# Patient Record
Sex: Male | Born: 1955 | Race: White | Hispanic: No | Marital: Married | Smoking: Former smoker
Health system: Southern US, Community
[De-identification: ages and names within clinical notes are randomized; demographics above are authoritative.]

## PROBLEM LIST (undated history)

## (undated) DIAGNOSIS — I2699 Other pulmonary embolism without acute cor pulmonale: Secondary | ICD-10-CM

## (undated) DIAGNOSIS — E7800 Pure hypercholesterolemia, unspecified: Secondary | ICD-10-CM

## (undated) DIAGNOSIS — Q619 Cystic kidney disease, unspecified: Secondary | ICD-10-CM

## (undated) DIAGNOSIS — K635 Polyp of colon: Secondary | ICD-10-CM

## (undated) DIAGNOSIS — R12 Heartburn: Secondary | ICD-10-CM

## (undated) DIAGNOSIS — R3129 Other microscopic hematuria: Secondary | ICD-10-CM

## (undated) DIAGNOSIS — C61 Malignant neoplasm of prostate: Secondary | ICD-10-CM

## (undated) DIAGNOSIS — E079 Disorder of thyroid, unspecified: Secondary | ICD-10-CM

## (undated) HISTORY — PX: ANKLE SURGERY: SHX546

## (undated) HISTORY — DX: Heartburn: R12

## (undated) HISTORY — DX: Polyp of colon: K63.5

## (undated) HISTORY — PX: COLONOSCOPY W/ POLYPECTOMY: SHX1380

## (undated) HISTORY — DX: Disorder of thyroid, unspecified: E07.9

## (undated) HISTORY — DX: Other microscopic hematuria: R31.29

## (undated) HISTORY — DX: Other pulmonary embolism without acute cor pulmonale: I26.99

## (undated) HISTORY — DX: Cystic kidney disease, unspecified: Q61.9

## (undated) HISTORY — DX: Malignant neoplasm of prostate: C61

## (undated) HISTORY — PX: APPENDECTOMY: SHX54

## (undated) HISTORY — DX: Pure hypercholesterolemia, unspecified: E78.00

---

## 1998-10-26 ENCOUNTER — Other Ambulatory Visit: Admission: RE | Admit: 1998-10-26 | Discharge: 1998-10-26 | Payer: Self-pay | Admitting: Internal Medicine

## 1999-02-25 ENCOUNTER — Emergency Department (HOSPITAL_COMMUNITY): Admission: EM | Admit: 1999-02-25 | Discharge: 1999-02-25 | Payer: Self-pay | Admitting: Emergency Medicine

## 2000-06-12 ENCOUNTER — Encounter: Payer: Self-pay | Admitting: Emergency Medicine

## 2000-06-12 ENCOUNTER — Emergency Department (HOSPITAL_COMMUNITY): Admission: EM | Admit: 2000-06-12 | Discharge: 2000-06-12 | Payer: Self-pay | Admitting: Emergency Medicine

## 2001-05-08 ENCOUNTER — Encounter (INDEPENDENT_AMBULATORY_CARE_PROVIDER_SITE_OTHER): Payer: Self-pay | Admitting: Specialist

## 2001-05-08 ENCOUNTER — Other Ambulatory Visit: Admission: RE | Admit: 2001-05-08 | Discharge: 2001-05-08 | Payer: Self-pay | Admitting: Internal Medicine

## 2004-11-30 ENCOUNTER — Ambulatory Visit: Payer: Self-pay | Admitting: Internal Medicine

## 2004-12-18 ENCOUNTER — Ambulatory Visit: Payer: Self-pay | Admitting: Internal Medicine

## 2006-04-14 ENCOUNTER — Encounter: Admission: RE | Admit: 2006-04-14 | Discharge: 2006-04-14 | Payer: Self-pay | Admitting: Internal Medicine

## 2007-09-17 DIAGNOSIS — Q619 Cystic kidney disease, unspecified: Secondary | ICD-10-CM

## 2007-09-17 DIAGNOSIS — R3129 Other microscopic hematuria: Secondary | ICD-10-CM

## 2007-09-17 HISTORY — DX: Cystic kidney disease, unspecified: Q61.9

## 2007-09-17 HISTORY — DX: Other microscopic hematuria: R31.29

## 2008-07-08 ENCOUNTER — Encounter: Admission: RE | Admit: 2008-07-08 | Discharge: 2008-07-08 | Payer: Self-pay | Admitting: Internal Medicine

## 2009-11-20 ENCOUNTER — Encounter (INDEPENDENT_AMBULATORY_CARE_PROVIDER_SITE_OTHER): Payer: Self-pay | Admitting: *Deleted

## 2010-03-30 ENCOUNTER — Encounter: Payer: Self-pay | Admitting: Internal Medicine

## 2010-04-30 ENCOUNTER — Encounter (INDEPENDENT_AMBULATORY_CARE_PROVIDER_SITE_OTHER): Payer: Self-pay | Admitting: *Deleted

## 2010-05-03 ENCOUNTER — Ambulatory Visit: Payer: Self-pay | Admitting: Internal Medicine

## 2010-05-17 ENCOUNTER — Ambulatory Visit: Payer: Self-pay | Admitting: Internal Medicine

## 2010-10-16 NOTE — Letter (Signed)
Summary: [facility]   Imported By: Lester Eustis 06/01/2010 14:05:10  _____________________________________________________________________  External Attachment:    Type:   Image     Comment:   External Document

## 2010-10-16 NOTE — Procedures (Signed)
Summary: Colonoscopy  Patient: Maurice Gomez Note: All result statuses are Final unless otherwise noted.  Tests: (1) Colonoscopy (COL)   COL Colonoscopy           DONE     [facility]     [address]     [place], [place]  [address]           COLONOSCOPY PROCEDURE REPORT           PATIENT:  Maurice, Gomez  MR#:  [id number]     BIRTHDATE:  07/25/1956, 53 yrs. old  GENDER:  male     ENDOSCOPIST:  Wilhemina Bonito. Eda Keys, MD     REF. BY:  Surveillance Program Recall,     PROCEDURE DATE:  05/17/2010     PROCEDURE:  Surveillance Colonoscopy     ASA CLASS:  Class II     INDICATIONS:  history of pre-cancerous (adenomatous) colon polyps,     surveillance and high-risk screening ; index 2000 w/ Ta; f/u 2002,     2006     MEDICATIONS:   Fentanyl 100 mcg IV, Versed 10 mg IV           DESCRIPTION OF PROCEDURE:   After the risks benefits and     alternatives of the procedure were thoroughly explained, informed     consent was obtained.  Digital rectal exam was performed and     revealed no abnormalities.   The LB [id number] endoscope     was introduced through the anus and advanced to the cecum, which     was identified by both the appendix and ileocecal valve, without     limitations.Time to cecum = 3:53 min.  The quality of the prep was     excellent, using MoviPrep.  The instrument was then slowly     withdrawn (time = 9:10 min) as the colon was fully examined.     <<PROCEDUREIMAGES>>           FINDINGS:  Moderate diverticulosis was found in the sigmoid colon.     This was otherwise a normal examination of the colon.  No polyps     or cancers were seen.   Retroflexed views in the rectum revealed     no abnormalities.    The scope was then withdrawn from the patient     and the procedure completed.           COMPLICATIONS:  None     ENDOSCOPIC IMPRESSION:     1) Moderate diverticulosis in the sigmoid colon     2) Otherwise normal examination     3) No polyps  or cancers           RECOMMENDATIONS:     1) Follow up colonoscopy in 5 years           ______________________________     Wilhemina Bonito. Eda Keys, MD           CC:  Georgann Housekeeper MD;The Patient           n.     Rosalie DoctorWilhemina Bonito. Eda Keys at 05/17/2010 12:39 PM           Richter, Noreene Larsson, [phone]  Note: An exclamation mark (!) indicates a result that was not dispersed into the flowsheet. Document Creation Date: 05/17/2010 12:40 PM _______________________________________________________________________  (1) Order result status: Final Collection or observation date-time: 05/17/2010 12:32 Requested date-time:  Receipt date-time:  Reported date-time:  Referring Physician:   Ordering Physician: Fransico Setters [phone]) Specimen Source:  Source: Launa Grill Order Number: [id number] Lab site:   Appended Document: Colonoscopy    Clinical Lists Changes  Observations: Added new observation of COLONNXTDUE: 05/2015 (05/17/2010 12:45)

## 2010-10-16 NOTE — Letter (Signed)
Summary: Previsit letter  [facility]  [address] [place], [place] [address]   Phone: [phone]  Fax: [phone]       03/30/2010 MRN: [id number]  [facility] [address] [place], [place]  [address]  Dear Mr. Maurice Gomez,  Welcome to the Gastroenterology Division at [facility]    You are scheduled to see a nurse for your pre-procedure visit on 05-03-10 at 4pm on the 3rd floor at [facility], [address]  We ask that you try to arrive at our office 15 minutes prior to your appointment time to allow for check-in.  Your nurse visit will consist of discussing your medical and surgical history, your immediate family medical history, and your medications.    Please bring a complete list of all your medications or, if you prefer, bring the medication bottles and we will list them.  We will need to be aware of both prescribed and over the counter drugs.  We will need to know exact dosage information as well.  If you are on blood thinners (Coumadin, Plavix, Aggrenox, Ticlid, etc.) please call our office today/prior to your appointment, as we need to consult with your physician about holding your medication.   Please be prepared to read and sign documents such as consent forms, a financial agreement, and acknowledgement forms.  If necessary, and with your consent, a friend or relative is welcome to sit-in on the nurse visit with you.  Please bring your insurance card so that we may make a copy of it.  If your insurance requires a referral to see a specialist, please bring your referral form from your primary care physician.  No co-pay is required for this nurse visit.     If you cannot keep your appointment, please call [phone] to cancel or reschedule prior to your appointment date.  This allows [place] the opportunity to schedule an appointment for another patient in need of care.    Thank you for choosing [facility] for your medical  needs.  We appreciate the opportunity to care for you.  Please visit [place] at our website  to learn more about our practice.                     Sincerely.                                                                                                                   The Gastroenterology Division

## 2010-10-16 NOTE — Miscellaneous (Signed)
Summary: [facility] Previsit/prep  Clinical Lists Changes  Medications: Added new medication of MOVIPREP 100 GM  SOLR (PEG-KCL-NACL-NASULF-NA ASC-C) As per prep instructions. - Signed Rx of MOVIPREP 100 GM  SOLR (PEG-KCL-NACL-NASULF-NA ASC-C) As per prep instructions.;  #1 x 0;  Signed;  Entered by: Wyona Almas RN;  Authorized by: Hilarie Fredrickson MD;  Method used: Electronically to [facility] [address] [place], [place], [place]  [address], Ph: [phone], Fax: [phone] Observations: Added new observation of NKA: T (05/03/2010 16:07)    Prescriptions: MOVIPREP 100 GM  SOLR (PEG-KCL-NACL-NASULF-NA ASC-C) As per prep instructions.  #1 x 0   Entered by:   Wyona Almas RN   Authorized by:   Hilarie Fredrickson MD   Signed by:   Wyona Almas RN on 05/03/2010   Method used:   Electronically to        [facility] (retail)       [address]       [place], [place]  [address]       Ph: [phone]       Fax: [phone]   RxID:   [id number]

## 2010-10-16 NOTE — Letter (Signed)
Summary: [facility] Instructions  [facility]  [address] [place], [place] [address]   Phone: [phone]  Fax: [phone]       Maurice Gomez    Oct 23, 1955    MRN: [id number]        Procedure Day /Date:  05/17/10  Thursday     Arrival Time:  10:30am     Procedure Time:  11:30am     Location of Procedure:                    _x _   Endoscopy Center (4th Floor)                        PREPARATION FOR COLONOSCOPY WITH MOVIPREP   Starting 5 days prior to your procedure 05/12/10  do not eat nuts, seeds, popcorn, corn, beans, peas,  salads, or any raw vegetables.  Do not take any fiber supplements (e.g. Metamucil, Citrucel, and Benefiber).  THE DAY BEFORE YOUR PROCEDURE         DATE:  05/16/10   DAY:  Wednesday  1.  Drink clear liquids the entire day-NO SOLID FOOD  2.  Do not drink anything colored red or purple.  Avoid juices with pulp.  No orange juice.  3.  Drink at least 64 oz. (8 glasses) of fluid/clear liquids during the day to prevent dehydration and help the prep work efficiently.  CLEAR LIQUIDS INCLUDE: Water Jello Ice Popsicles Tea (sugar ok, no milk/cream) Powdered fruit flavored drinks Coffee (sugar ok, no milk/cream) Gatorade Juice: apple, white grape, white cranberry  Lemonade Clear bullion, consomm, broth Carbonated beverages (any kind) Strained chicken noodle soup Hard Candy                             4.  In the morning, mix first dose of MoviPrep solution:    Empty 1 Pouch A and 1 Pouch B into the disposable container    Add lukewarm drinking water to the top line of the container. Mix to dissolve    Refrigerate (mixed solution should be used within 24 hrs)  5.  Begin drinking the prep at 5:00 p.m. The MoviPrep container is divided by 4 marks.   Every 15 minutes drink the solution down to the next mark (approximately 8 oz) until the full liter is complete.   6.  Follow completed prep with 16 oz of clear liquid of your  choice (Nothing red or purple).  Continue to drink clear liquids until bedtime.  7.  Before going to bed, mix second dose of MoviPrep solution:    Empty 1 Pouch A and 1 Pouch B into the disposable container    Add lukewarm drinking water to the top line of the container. Mix to dissolve    Refrigerate  THE DAY OF YOUR PROCEDURE      DATE:  05/17/10  DAY:  Thursday  Beginning at  6:30am  (5 hours before procedure):         1. Every 15 minutes, drink the solution down to the next mark (approx 8 oz) until the full liter is complete.  2. Follow completed prep with 16 oz. of clear liquid of your choice.    3. You may drink clear liquids until  9:30am  (2 HOURS BEFORE PROCEDURE).   MEDICATION INSTRUCTIONS  Unless otherwise instructed, you should take regular prescription medications with a small sip  of water   as early as possible the morning of your procedure.        OTHER INSTRUCTIONS  You will need a responsible adult at least 55 years of age to accompany you and drive you home.   This person must remain in the waiting room during your procedure.  Wear loose fitting clothing that is easily removed.  Leave jewelry and other valuables at home.  However, you may wish to bring a book to read or  an iPod/MP3 player to listen to music as you wait for your procedure to start.  Remove all body piercing jewelry and leave at home.  Total time from sign-in until discharge is approximately 2-3 hours.  You should go home directly after your procedure and rest.  You can resume normal activities the  day after your procedure.  The day of your procedure you should not:   Drive   Make legal decisions   Operate machinery   Drink alcohol   Return to work  You will receive specific instructions about eating, activities and medications before you leave.    The above instructions have been reviewed and explained to me by   Wyona Almas RN  May 03, 2010 4:33 PM     I  fully understand and can verbalize these instructions _____________________________ Date _________

## 2010-10-16 NOTE — Letter (Signed)
Summary: Colonoscopy Letter  [facility]  [address] [place], [place] [address]   Phone: [phone]  Fax: [phone]      November 20, 2009 MRN: [id number]   [facility] [address] [place], [place]  [address]   Dear Mr. LAMBOY,   According to your medical record, it is time for you to schedule a Colonoscopy. The American Cancer Society recommends this procedure as a method to detect early colon cancer. Patients with a family history of colon cancer, or a personal history of colon polyps or inflammatory bowel disease are at increased risk.  This letter has been generated based on the recommendations made at the time of your procedure. If you feel that in your particular situation this may no longer apply, please contact our office.  Please call our office at [phone] to schedule this appointment or to update your records at your earliest convenience.  Thank you for cooperating with [place] to provide you with the very best care possible.   Sincerely,  Wilhemina Bonito. Marina Goodell, M.D.  [facility] Gastroenterology Division [phone]

## 2012-07-27 DIAGNOSIS — C61 Malignant neoplasm of prostate: Secondary | ICD-10-CM

## 2012-07-27 HISTORY — DX: Malignant neoplasm of prostate: C61

## 2012-08-12 ENCOUNTER — Encounter: Payer: Self-pay | Admitting: *Deleted

## 2012-08-12 DIAGNOSIS — E079 Disorder of thyroid, unspecified: Secondary | ICD-10-CM | POA: Insufficient documentation

## 2012-08-12 DIAGNOSIS — C61 Malignant neoplasm of prostate: Secondary | ICD-10-CM | POA: Insufficient documentation

## 2012-08-12 DIAGNOSIS — Q619 Cystic kidney disease, unspecified: Secondary | ICD-10-CM | POA: Insufficient documentation

## 2012-08-12 DIAGNOSIS — R12 Heartburn: Secondary | ICD-10-CM | POA: Insufficient documentation

## 2012-08-12 DIAGNOSIS — R3129 Other microscopic hematuria: Secondary | ICD-10-CM | POA: Insufficient documentation

## 2012-08-12 DIAGNOSIS — E7800 Pure hypercholesterolemia, unspecified: Secondary | ICD-10-CM | POA: Insufficient documentation

## 2012-08-12 DIAGNOSIS — K635 Polyp of colon: Secondary | ICD-10-CM | POA: Insufficient documentation

## 2012-08-12 DIAGNOSIS — I2699 Other pulmonary embolism without acute cor pulmonale: Secondary | ICD-10-CM | POA: Insufficient documentation

## 2012-08-12 NOTE — Progress Notes (Signed)
Married, [employer], 2 sons, 1 daughter  2009 PSA 2.27 2013 PSA 4.02

## 2012-08-16 ENCOUNTER — Encounter: Payer: Self-pay | Admitting: Radiation Oncology

## 2012-08-16 NOTE — Progress Notes (Signed)
Radiation Oncology         [phone]) [phone] ________________________________  Initial outpatient Consultation  Name: Maurice Gomez MRN: [id number]  Date: 08/17/2012  DOB: [birth year]  CC:No primary provider on file.  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 gentleman with a family history of prostate cancer.  He was noted to have an elevated PSA of 4.02 by his primary care physician, Dr. Rene Paci.  Accordingly, he was referred for evaluation in urology by Dr. Berneice Heinrich on 06/02/12,  digital rectal examination was performed at that time revealing an enlarged prostate at perhaps 60+ grams with no nodules.  Repeat PSA after antibiotics remained somewhat elevated at 3.4 The patient proceeded to transrectal ultrasound with 12 biopsies of the prostate on 07/27/2012.  The prostate volume measured 37 cc.  Out of 12 core biopsies, 2 were positive.  The maximum Gleason score was 3+3, and this was seen in less than 5% of the right base and approximately 5% of the right lateral base.  The patient reviewed the biopsy results with his urologist and he has kindly been referred today for discussion of potential radiation treatment options. Marland Kitchen  PREVIOUS RADIATION THERAPY: No  PAST MEDICAL HISTORY:  has a past medical history of Prostate cancer (07/27/12); Polyp of colon; Hypercholesterolemia; Thyroid disease; Pulmonary embolism; Microscopic hematuria (2009); Cystic kidney disease (2009); and Heartburn.    PAST SURGICAL HISTORY: Past Surgical History  Procedure Date  . Colonoscopy w/ polypectomy   . Ankle surgery   . Appendectomy     FAMILY HISTORY: family history includes Cancer in his brother.  SOCIAL HISTORY:  reports that he quit smoking about 25 years ago. His smoking use included Cigarettes. He does  not have any smokeless tobacco history on file. He reports that he does not drink alcohol or use illicit drugs.  ALLERGIES: Morphine and related  MEDICATIONS:  Current Outpatient Prescriptions  Medication Sig Dispense Refill  . colesevelam (WELCHOL) 625 MG tablet Take 1,875 mg by mouth 2 (two) times daily with a meal.      . levothyroxine (SYNTHROID, LEVOTHROID) 100 MCG tablet Take 100 mcg by mouth daily.      Marland Kitchen lisdexamfetamine (VYVANSE) 50 MG capsule Take 50 mg by mouth every morning.      . Tamsulosin HCl (FLOMAX) 0.4 MG CAPS Take by mouth.      . temazepam (RESTORIL) 30 MG capsule         REVIEW OF SYSTEMS:  A 15 point review of systems is documented in the electronic medical record. This was obtained by the nursing staff. However, I reviewed this with the patient to discuss relevant findings and make appropriate changes.  A comprehensive review of systems was negative.  His IPSS Score was 13 on Flomax.  His erectile function questionnaire indicated minimal if any ED   PHYSICAL EXAM:  height is 5\' 10"  (1.778 m) and weight is 209 lb 4.8 oz (94.938 kg). His oral temperature is 99.1 F (37.3 C). His blood pressure is 133/73 and his pulse is 86. His respiration is 20.   Detailed exam deferred.    IMPRESSION: This patient is a very nice 56 year old gentleman with favorable risk prostate cancer. He has stage T1c disease with Gleason score 3+3 PSA as high as 4. He is eligible for writing of potential  approaches including active surveillance, radical prostatectomy and radiation treatment in the form of external radiation versus brachytherapy.  PLAN:Today I reviewed the findings and workup thus far.  We discussed the natural history of prostate cancer.  We reviewed the the implications of T-stage, Gleason's Score, and PSA on decision-making and outcomes in prostate cancer.  We discussed radiation treatment in the management of prostate cancer with regard to the logistics and delivery of external  beam radiation treatment as well as the logistics and delivery of prostate brachytherapy.  We compared and contrasted each of these approaches and also compared these against prostatectomy.  The patient expressed some interest in prostate brachytherapy versus active surveillance.  I filled out a patient counseling form for him with relevant treatment diagrams and we retained a copy for our records.   The patient would like to proceed with active surveillance or at least delayed intervention with possible prostate seed implant in the future.  I will share my findings with Dr. Berneice Heinrich and look forward to following his progress.     I enjoyed meeting with him today, and will look forward to participating in the care of this very nice gentleman.   I spent 60 minutes minutes face to face with the patient and more than 50% of that time was spent in counseling and/or coordination of care.   ------------------------------------------------  Artist Pais. Kathrynn Running, M.D.

## 2012-08-17 ENCOUNTER — Encounter: Payer: Self-pay | Admitting: Radiation Oncology

## 2012-08-17 ENCOUNTER — Ambulatory Visit
Admission: RE | Admit: 2012-08-17 | Discharge: 2012-08-17 | Disposition: A | Discharge status: Home / Self Care | Payer: 59 | Source: Ambulatory Visit | Attending: Radiation Oncology | Admitting: Radiation Oncology

## 2012-08-17 VITALS — BP 133/73 | HR 86 | Temp 99.1°F | Resp 20 | Ht 70.0 in | Wt 209.3 lb

## 2012-08-17 DIAGNOSIS — C61 Malignant neoplasm of prostate: Secondary | ICD-10-CM

## 2012-08-17 NOTE — Progress Notes (Signed)
Please see the Nurse Progress Note in the MD Initial Consult Encounter for this patient. 

## 2012-08-17 NOTE — Progress Notes (Signed)
Married, [employer], 2 sons, 1 daughter   2009 PSA 2.27  2013 PSA 4.02   Pt considering brachytherapy vs surveillance.  IPSS score 13 today

## 2012-08-21 NOTE — Addendum Note (Signed)
Encounter addended by: Hlee Fringer Mintz Ashely Goosby, RN on: 08/21/2012  5:10 PM<BR>     Documentation filed: Charges VN

## 2015-05-23 ENCOUNTER — Encounter: Payer: Self-pay | Admitting: Internal Medicine

## 2015-06-07 ENCOUNTER — Encounter: Payer: Self-pay | Admitting: Internal Medicine

## 2015-06-12 ENCOUNTER — Encounter: Payer: Self-pay | Admitting: Internal Medicine

## 2015-07-25 ENCOUNTER — Ambulatory Visit (AMBULATORY_SURGERY_CENTER): Payer: Self-pay

## 2015-07-25 VITALS — Ht 69.5 in | Wt 206.2 lb

## 2015-07-25 DIAGNOSIS — Z860100 Personal history of colon polyps, unspecified: Secondary | ICD-10-CM

## 2015-07-25 MED ORDER — SUPREP BOWEL PREP KIT 17.5-3.13-1.6 GM/177ML PO SOLN: 1.0000 | Freq: Once | ORAL | Status: DC

## 2015-07-25 NOTE — Progress Notes (Signed)
No allergies to eggs or soy No home oxygen No diet/weight loss meds No past problems with anesthesia  Has email has internet; refused emmi; has had before

## 2015-08-01 ENCOUNTER — Ambulatory Visit (AMBULATORY_SURGERY_CENTER): Payer: Commercial Managed Care - HMO | Admitting: Internal Medicine

## 2015-08-01 ENCOUNTER — Encounter: Payer: Self-pay | Admitting: Internal Medicine

## 2015-08-01 VITALS — BP 130/94 | HR 57 | Temp 97.0°F | Resp 30 | Ht 69.0 in | Wt 206.0 lb

## 2015-08-01 DIAGNOSIS — Z86010 Personal history of colonic polyps: Secondary | ICD-10-CM

## 2015-08-01 MED ORDER — SODIUM CHLORIDE 0.9 % IV SOLN: 500.0000 mL | INTRAVENOUS | Status: DC

## 2015-08-01 NOTE — Op Note (Signed)
[place]  [address]. [place], [address]   COLONOSCOPY PROCEDURE REPORT  PATIENT: Maurice Gomez, Maurice Gomez  MR#: [id number] BIRTHDATE: [birth year] , 21  yrs. old GENDER: male ENDOSCOPIST: Eustace Quail, MD REFERRED [id number] Program Recall PROCEDURE DATE:  08/01/2015 PROCEDURE:   Colonoscopy, surveillance First Screening Colonoscopy - Avg.  risk and is 50 yrs.  old or older - No.  Prior Negative Screening - Now for repeat screening. N/A  History of Adenoma - Now for follow-up colonoscopy & has been > or = to 3 yrs.  Yes hx of adenoma.  Has been 3 or more years since last colonoscopy.  Polyps removed today? No Recommend repeat exam, <10 yrs? No ASA CLASS:   Class II INDICATIONS:Surveillance due to prior colonic neoplasia and PH Colon Adenoma.   . Index examination 2000 with diminutive adenomas. Follow-up examinations 2002, 2006, and 2011 (negative) MEDICATIONS: Monitored anesthesia care and Propofol 300 mg IV  DESCRIPTION OF PROCEDURE:   After the risks benefits and alternatives of the procedure were thoroughly explained, informed consent was obtained.  The digital rectal exam revealed no abnormalities of the rectum.   The LB [id number] and PENTAX EUS SCOPE  endoscope was introduced through the anus and advanced to the cecum, which was identified by both the appendix and ileocecal valve. No adverse events experienced.   The quality of the prep was excellent.  (Suprep was used)  The instrument was then slowly withdrawn as the colon was fully examined. Estimated blood loss is zero unless otherwise noted in this procedure report.  COLON FINDINGS: There was mild diverticulosis noted in the sigmoid colon.   The examination was otherwise normal. No polyps. Retroflexed views revealed internal hemorrhoids. The time to cecum = 1.4 Withdrawal time = 9.2   The scope was withdrawn and the procedure completed. COMPLICATIONS: There were no immediate  complications.  ENDOSCOPIC IMPRESSION: 1.   Mild diverticulosis was noted in the sigmoid colon 2.   The examination was otherwise normal . No polyps  RECOMMENDATIONS: 1. Continue current colorectal surveillance recommendations with a repeat colonoscopy in 10 years.  eSigned:  Eustace Quail, MD 08/01/2015 2:44 PM   cc: The Patient and Wenda Low MD

## 2015-08-01 NOTE — Progress Notes (Signed)
Report to PACU, RN, vss, BBS= Clear.  

## 2015-08-01 NOTE — Patient Instructions (Signed)

## 2015-08-02 ENCOUNTER — Telehealth: Payer: Self-pay | Admitting: *Deleted

## 2015-08-02 NOTE — Telephone Encounter (Signed)
Name identifier, left message, follow-up 

## 2016-11-05 DIAGNOSIS — M9902 Segmental and somatic dysfunction of thoracic region: Secondary | ICD-10-CM | POA: Diagnosis not present

## 2016-11-05 DIAGNOSIS — M9903 Segmental and somatic dysfunction of lumbar region: Secondary | ICD-10-CM | POA: Diagnosis not present

## 2016-11-05 DIAGNOSIS — M5386 Other specified dorsopathies, lumbar region: Secondary | ICD-10-CM | POA: Diagnosis not present

## 2017-02-20 DIAGNOSIS — M5417 Radiculopathy, lumbosacral region: Secondary | ICD-10-CM | POA: Diagnosis not present

## 2017-02-20 DIAGNOSIS — M9905 Segmental and somatic dysfunction of pelvic region: Secondary | ICD-10-CM | POA: Diagnosis not present

## 2017-02-20 DIAGNOSIS — M9903 Segmental and somatic dysfunction of lumbar region: Secondary | ICD-10-CM | POA: Diagnosis not present

## 2017-02-27 DIAGNOSIS — M9905 Segmental and somatic dysfunction of pelvic region: Secondary | ICD-10-CM | POA: Diagnosis not present

## 2017-02-27 DIAGNOSIS — M5417 Radiculopathy, lumbosacral region: Secondary | ICD-10-CM | POA: Diagnosis not present

## 2017-02-27 DIAGNOSIS — M9903 Segmental and somatic dysfunction of lumbar region: Secondary | ICD-10-CM | POA: Diagnosis not present

## 2017-02-28 DIAGNOSIS — C61 Malignant neoplasm of prostate: Secondary | ICD-10-CM | POA: Diagnosis not present

## 2017-03-04 DIAGNOSIS — C61 Malignant neoplasm of prostate: Secondary | ICD-10-CM | POA: Diagnosis not present

## 2017-03-04 DIAGNOSIS — N401 Enlarged prostate with lower urinary tract symptoms: Secondary | ICD-10-CM | POA: Diagnosis not present

## 2017-03-04 DIAGNOSIS — R3912 Poor urinary stream: Secondary | ICD-10-CM | POA: Diagnosis not present

## 2017-03-06 DIAGNOSIS — M5417 Radiculopathy, lumbosacral region: Secondary | ICD-10-CM | POA: Diagnosis not present

## 2017-03-06 DIAGNOSIS — M9903 Segmental and somatic dysfunction of lumbar region: Secondary | ICD-10-CM | POA: Diagnosis not present

## 2017-03-06 DIAGNOSIS — M9905 Segmental and somatic dysfunction of pelvic region: Secondary | ICD-10-CM | POA: Diagnosis not present

## 2017-03-27 DIAGNOSIS — M9905 Segmental and somatic dysfunction of pelvic region: Secondary | ICD-10-CM | POA: Diagnosis not present

## 2017-03-27 DIAGNOSIS — M5417 Radiculopathy, lumbosacral region: Secondary | ICD-10-CM | POA: Diagnosis not present

## 2017-03-27 DIAGNOSIS — M9903 Segmental and somatic dysfunction of lumbar region: Secondary | ICD-10-CM | POA: Diagnosis not present

## 2017-05-05 DIAGNOSIS — R109 Unspecified abdominal pain: Secondary | ICD-10-CM | POA: Diagnosis not present

## 2017-05-09 DIAGNOSIS — M9905 Segmental and somatic dysfunction of pelvic region: Secondary | ICD-10-CM | POA: Diagnosis not present

## 2017-05-09 DIAGNOSIS — M5417 Radiculopathy, lumbosacral region: Secondary | ICD-10-CM | POA: Diagnosis not present

## 2017-05-09 DIAGNOSIS — M9903 Segmental and somatic dysfunction of lumbar region: Secondary | ICD-10-CM | POA: Diagnosis not present

## 2017-05-30 DIAGNOSIS — M9903 Segmental and somatic dysfunction of lumbar region: Secondary | ICD-10-CM | POA: Diagnosis not present

## 2017-05-30 DIAGNOSIS — M5417 Radiculopathy, lumbosacral region: Secondary | ICD-10-CM | POA: Diagnosis not present

## 2017-05-30 DIAGNOSIS — M9905 Segmental and somatic dysfunction of pelvic region: Secondary | ICD-10-CM | POA: Diagnosis not present

## 2017-06-10 DIAGNOSIS — L821 Other seborrheic keratosis: Secondary | ICD-10-CM | POA: Diagnosis not present

## 2017-06-10 DIAGNOSIS — D225 Melanocytic nevi of trunk: Secondary | ICD-10-CM | POA: Diagnosis not present

## 2017-06-10 DIAGNOSIS — D1801 Hemangioma of skin and subcutaneous tissue: Secondary | ICD-10-CM | POA: Diagnosis not present

## 2017-06-10 DIAGNOSIS — L918 Other hypertrophic disorders of the skin: Secondary | ICD-10-CM | POA: Diagnosis not present

## 2017-06-13 DIAGNOSIS — Z0000 Encounter for general adult medical examination without abnormal findings: Secondary | ICD-10-CM | POA: Diagnosis not present

## 2017-06-13 DIAGNOSIS — E039 Hypothyroidism, unspecified: Secondary | ICD-10-CM | POA: Diagnosis not present

## 2017-06-13 DIAGNOSIS — E782 Mixed hyperlipidemia: Secondary | ICD-10-CM | POA: Diagnosis not present

## 2017-06-13 DIAGNOSIS — C61 Malignant neoplasm of prostate: Secondary | ICD-10-CM | POA: Diagnosis not present

## 2017-06-13 DIAGNOSIS — Z23 Encounter for immunization: Secondary | ICD-10-CM | POA: Diagnosis not present

## 2017-06-27 DIAGNOSIS — M9903 Segmental and somatic dysfunction of lumbar region: Secondary | ICD-10-CM | POA: Diagnosis not present

## 2017-06-27 DIAGNOSIS — M5417 Radiculopathy, lumbosacral region: Secondary | ICD-10-CM | POA: Diagnosis not present

## 2017-06-27 DIAGNOSIS — M9905 Segmental and somatic dysfunction of pelvic region: Secondary | ICD-10-CM | POA: Diagnosis not present

## 2017-07-18 DIAGNOSIS — M9903 Segmental and somatic dysfunction of lumbar region: Secondary | ICD-10-CM | POA: Diagnosis not present

## 2017-07-18 DIAGNOSIS — M5417 Radiculopathy, lumbosacral region: Secondary | ICD-10-CM | POA: Diagnosis not present

## 2017-07-18 DIAGNOSIS — M9905 Segmental and somatic dysfunction of pelvic region: Secondary | ICD-10-CM | POA: Diagnosis not present

## 2017-07-25 DIAGNOSIS — M9903 Segmental and somatic dysfunction of lumbar region: Secondary | ICD-10-CM | POA: Diagnosis not present

## 2017-07-25 DIAGNOSIS — M9905 Segmental and somatic dysfunction of pelvic region: Secondary | ICD-10-CM | POA: Diagnosis not present

## 2017-07-25 DIAGNOSIS — M5417 Radiculopathy, lumbosacral region: Secondary | ICD-10-CM | POA: Diagnosis not present

## 2017-08-05 DIAGNOSIS — M5417 Radiculopathy, lumbosacral region: Secondary | ICD-10-CM | POA: Diagnosis not present

## 2017-08-05 DIAGNOSIS — M9903 Segmental and somatic dysfunction of lumbar region: Secondary | ICD-10-CM | POA: Diagnosis not present

## 2017-08-05 DIAGNOSIS — M9905 Segmental and somatic dysfunction of pelvic region: Secondary | ICD-10-CM | POA: Diagnosis not present

## 2017-09-02 DIAGNOSIS — M9903 Segmental and somatic dysfunction of lumbar region: Secondary | ICD-10-CM | POA: Diagnosis not present

## 2017-09-02 DIAGNOSIS — M5417 Radiculopathy, lumbosacral region: Secondary | ICD-10-CM | POA: Diagnosis not present

## 2017-09-02 DIAGNOSIS — M9905 Segmental and somatic dysfunction of pelvic region: Secondary | ICD-10-CM | POA: Diagnosis not present

## 2017-09-23 DIAGNOSIS — C61 Malignant neoplasm of prostate: Secondary | ICD-10-CM | POA: Diagnosis not present

## 2017-09-30 DIAGNOSIS — C61 Malignant neoplasm of prostate: Secondary | ICD-10-CM | POA: Diagnosis not present

## 2017-09-30 DIAGNOSIS — R3912 Poor urinary stream: Secondary | ICD-10-CM | POA: Diagnosis not present

## 2017-09-30 DIAGNOSIS — Z8042 Family history of malignant neoplasm of prostate: Secondary | ICD-10-CM | POA: Diagnosis not present

## 2017-10-03 DIAGNOSIS — N410 Acute prostatitis: Secondary | ICD-10-CM | POA: Diagnosis not present

## 2017-10-09 DIAGNOSIS — R8271 Bacteriuria: Secondary | ICD-10-CM | POA: Diagnosis not present

## 2017-10-09 DIAGNOSIS — R3912 Poor urinary stream: Secondary | ICD-10-CM | POA: Diagnosis not present

## 2017-10-09 DIAGNOSIS — C61 Malignant neoplasm of prostate: Secondary | ICD-10-CM | POA: Diagnosis not present

## 2017-10-09 DIAGNOSIS — N410 Acute prostatitis: Secondary | ICD-10-CM | POA: Diagnosis not present

## 2017-10-09 DIAGNOSIS — R3129 Other microscopic hematuria: Secondary | ICD-10-CM | POA: Diagnosis not present

## 2017-12-24 DIAGNOSIS — Z23 Encounter for immunization: Secondary | ICD-10-CM | POA: Diagnosis not present

## 2018-02-12 DIAGNOSIS — R609 Edema, unspecified: Secondary | ICD-10-CM | POA: Diagnosis not present

## 2018-02-23 DIAGNOSIS — M9902 Segmental and somatic dysfunction of thoracic region: Secondary | ICD-10-CM | POA: Diagnosis not present

## 2018-02-23 DIAGNOSIS — M5417 Radiculopathy, lumbosacral region: Secondary | ICD-10-CM | POA: Diagnosis not present

## 2018-02-23 DIAGNOSIS — M9903 Segmental and somatic dysfunction of lumbar region: Secondary | ICD-10-CM | POA: Diagnosis not present

## 2018-02-26 DIAGNOSIS — M9903 Segmental and somatic dysfunction of lumbar region: Secondary | ICD-10-CM | POA: Diagnosis not present

## 2018-02-26 DIAGNOSIS — M9902 Segmental and somatic dysfunction of thoracic region: Secondary | ICD-10-CM | POA: Diagnosis not present

## 2018-02-26 DIAGNOSIS — M5417 Radiculopathy, lumbosacral region: Secondary | ICD-10-CM | POA: Diagnosis not present

## 2018-03-02 DIAGNOSIS — M9903 Segmental and somatic dysfunction of lumbar region: Secondary | ICD-10-CM | POA: Diagnosis not present

## 2018-03-02 DIAGNOSIS — M9902 Segmental and somatic dysfunction of thoracic region: Secondary | ICD-10-CM | POA: Diagnosis not present

## 2018-03-02 DIAGNOSIS — M5417 Radiculopathy, lumbosacral region: Secondary | ICD-10-CM | POA: Diagnosis not present

## 2018-03-05 DIAGNOSIS — M9903 Segmental and somatic dysfunction of lumbar region: Secondary | ICD-10-CM | POA: Diagnosis not present

## 2018-03-05 DIAGNOSIS — M9902 Segmental and somatic dysfunction of thoracic region: Secondary | ICD-10-CM | POA: Diagnosis not present

## 2018-03-05 DIAGNOSIS — M5417 Radiculopathy, lumbosacral region: Secondary | ICD-10-CM | POA: Diagnosis not present

## 2018-04-09 DIAGNOSIS — M9903 Segmental and somatic dysfunction of lumbar region: Secondary | ICD-10-CM | POA: Diagnosis not present

## 2018-04-09 DIAGNOSIS — M9902 Segmental and somatic dysfunction of thoracic region: Secondary | ICD-10-CM | POA: Diagnosis not present

## 2018-04-09 DIAGNOSIS — M5417 Radiculopathy, lumbosacral region: Secondary | ICD-10-CM | POA: Diagnosis not present

## 2018-05-07 DIAGNOSIS — M5417 Radiculopathy, lumbosacral region: Secondary | ICD-10-CM | POA: Diagnosis not present

## 2018-05-07 DIAGNOSIS — M9903 Segmental and somatic dysfunction of lumbar region: Secondary | ICD-10-CM | POA: Diagnosis not present

## 2018-05-07 DIAGNOSIS — M9902 Segmental and somatic dysfunction of thoracic region: Secondary | ICD-10-CM | POA: Diagnosis not present

## 2018-05-13 DIAGNOSIS — M9901 Segmental and somatic dysfunction of cervical region: Secondary | ICD-10-CM | POA: Diagnosis not present

## 2018-05-13 DIAGNOSIS — M9903 Segmental and somatic dysfunction of lumbar region: Secondary | ICD-10-CM | POA: Diagnosis not present

## 2018-05-13 DIAGNOSIS — M9902 Segmental and somatic dysfunction of thoracic region: Secondary | ICD-10-CM | POA: Diagnosis not present

## 2018-05-21 DIAGNOSIS — M9902 Segmental and somatic dysfunction of thoracic region: Secondary | ICD-10-CM | POA: Diagnosis not present

## 2018-05-21 DIAGNOSIS — M9903 Segmental and somatic dysfunction of lumbar region: Secondary | ICD-10-CM | POA: Diagnosis not present

## 2018-05-21 DIAGNOSIS — M9901 Segmental and somatic dysfunction of cervical region: Secondary | ICD-10-CM | POA: Diagnosis not present

## 2018-06-04 DIAGNOSIS — M9901 Segmental and somatic dysfunction of cervical region: Secondary | ICD-10-CM | POA: Diagnosis not present

## 2018-06-04 DIAGNOSIS — M9902 Segmental and somatic dysfunction of thoracic region: Secondary | ICD-10-CM | POA: Diagnosis not present

## 2018-06-04 DIAGNOSIS — M9903 Segmental and somatic dysfunction of lumbar region: Secondary | ICD-10-CM | POA: Diagnosis not present

## 2018-06-15 DIAGNOSIS — E782 Mixed hyperlipidemia: Secondary | ICD-10-CM | POA: Diagnosis not present

## 2018-06-15 DIAGNOSIS — Z0000 Encounter for general adult medical examination without abnormal findings: Secondary | ICD-10-CM | POA: Diagnosis not present

## 2018-06-15 DIAGNOSIS — Z23 Encounter for immunization: Secondary | ICD-10-CM | POA: Diagnosis not present

## 2018-08-11 ENCOUNTER — Other Ambulatory Visit: Payer: Self-pay | Admitting: Psychiatry

## 2018-08-11 MED ORDER — LISDEXAMFETAMINE DIMESYLATE 60 MG PO CAPS: 60.0000 mg | ORAL_CAPSULE | ORAL | 0 refills | Status: DC

## 2018-08-11 NOTE — Progress Notes (Unsigned)
vyvanse refill 60 mg. Needs appt.

## 2018-08-21 DIAGNOSIS — M9902 Segmental and somatic dysfunction of thoracic region: Secondary | ICD-10-CM | POA: Diagnosis not present

## 2018-08-21 DIAGNOSIS — M9903 Segmental and somatic dysfunction of lumbar region: Secondary | ICD-10-CM | POA: Diagnosis not present

## 2018-08-21 DIAGNOSIS — M9901 Segmental and somatic dysfunction of cervical region: Secondary | ICD-10-CM | POA: Diagnosis not present

## 2018-08-25 ENCOUNTER — Other Ambulatory Visit: Payer: Self-pay | Admitting: Psychiatry

## 2018-08-25 DIAGNOSIS — F902 Attention-deficit hyperactivity disorder, combined type: Secondary | ICD-10-CM

## 2018-08-25 MED ORDER — AMPHETAMINE-DEXTROAMPHETAMINE 15 MG PO TABS: 15.0000 mg | ORAL_TABLET | Freq: Three times a day (TID) | ORAL | 0 refills | Status: DC

## 2018-08-25 NOTE — Progress Notes (Signed)
adderall refill

## 2018-08-27 ENCOUNTER — Ambulatory Visit (INDEPENDENT_AMBULATORY_CARE_PROVIDER_SITE_OTHER): Payer: 59 | Admitting: Psychiatry

## 2018-08-27 ENCOUNTER — Encounter: Payer: Self-pay | Admitting: Psychiatry

## 2018-08-27 DIAGNOSIS — F5105 Insomnia due to other mental disorder: Secondary | ICD-10-CM | POA: Diagnosis not present

## 2018-08-27 DIAGNOSIS — F902 Attention-deficit hyperactivity disorder, combined type: Secondary | ICD-10-CM

## 2018-08-27 MED ORDER — TRAZODONE HCL 50 MG PO TABS: 50.0000 mg | ORAL_TABLET | Freq: Every day | ORAL | 1 refills | Status: DC

## 2018-08-27 MED ORDER — ZOLPIDEM TARTRATE 3.5 MG SL SUBL: 3.5000 mg | SUBLINGUAL_TABLET | Freq: Every evening | SUBLINGUAL | 1 refills | Status: DC | PRN

## 2018-08-27 MED ORDER — LISDEXAMFETAMINE DIMESYLATE 60 MG PO CAPS: 60.0000 mg | ORAL_CAPSULE | ORAL | 0 refills | Status: DC

## 2018-08-27 NOTE — Patient Instructions (Signed)
Try trazodone 1 or 2 at night to prevent the awakening.  If it partially works but she needed dosage increase just let us know.  If you have early morning awakening try the intermezzo under your tongue.  Read Marland Kitchen handouts.

## 2018-08-27 NOTE — Progress Notes (Addendum)
Goebel Hellums Inghram [id number] [birth year] 62 y.o.  Subjective:   Patient ID:  Maurice Gomez is a 62 y.o. (DOB 1955/10/16) male.  Chief Complaint:  Chief Complaint  Patient presents with  . Follow-up    Medication management  . Sleeping Problem    HPI Maurice Gomez presents to the office today for follow-up of ADD.  Increased Vyvanse to 60 mg at last visit.  Problems with sleep.  No problems going. Awaken after 4 hours and difficult to go back to sleep.  Gettting worse and going on for a year.  Misses the lack of sleep. Temazepam inconsistently keeps him asleep and doesn't want to take it all the time DT feeling drugged.  Doesn't believe related to stimulant bc independent of vyvanse use.  Never comfortable when he sleeps. Some pain issues, laying.  Tried new mattress.  Tried sonata at onset but awoke.  Also has to get up to urinate.  Prior sleep meds tried include: Ambien which worked but nervous over the withdrawal effects he had in the past.  Restoril 30, Xanax, Diazepam,  Other previous psychiatric medications include include Ritalin, Concerta 36, Ritalin SR, Daytrana with skin irritation, Vyvanse 60, Adderall, Mydayis, Strattera  Review of Systems:  Review of Systems  Musculoskeletal: Positive for arthralgias, back pain and neck pain.  Psychiatric/Behavioral: Positive for sleep disturbance. Negative for agitation, behavioral problems, confusion, decreased concentration, dysphoric mood, hallucinations, self-injury and suicidal ideas. The patient is not nervous/anxious and is not hyperactive.     Medications: Prior to Admission: (Not in a hospital admission)   Current Outpatient Medications  Medication Sig Dispense Refill  . amphetamine-dextroamphetamine (ADDERALL) 15 MG tablet Take 1 tablet by mouth 3 (three) times daily. 90 tablet 0  . finasteride (PROSCAR) 5 MG tablet Take 5 mg by mouth daily.    Marland Kitchen levothyroxine (SYNTHROID, LEVOTHROID) 100 MCG tablet Take  100 mcg by mouth daily.    Marland Kitchen lisdexamfetamine (VYVANSE) 60 MG capsule Take 1 capsule (60 mg total) by mouth every morning. 30 capsule 0  . lithium carbonate 150 MG capsule Take 150 mg by mouth daily.    . pantoprazole (PROTONIX) 20 MG tablet Take 20 mg by mouth daily.    . Tamsulosin HCl (FLOMAX) 0.4 MG CAPS Take by mouth.    . temazepam (RESTORIL) 30 MG capsule at bedtime as needed.     Derrill Memo ON 09/24/2018] lisdexamfetamine (VYVANSE) 60 MG capsule Take 1 capsule (60 mg total) by mouth every morning. 30 capsule 0  . [START ON 10/22/2018] lisdexamfetamine (VYVANSE) 60 MG capsule Take 1 capsule (60 mg total) by mouth every morning. 30 capsule 0  . traZODone (DESYREL) 50 MG tablet Take 1-2 tablets (50-100 mg total) by mouth at bedtime. 60 tablet 1  . Zolpidem Tartrate (INTERMEZZO) 3.5 MG SUBL Place 3.5 mg under the tongue at bedtime as needed. 30 tablet 1   No current facility-administered medications for this visit.     Medication Side Effects: None  Allergies:  Allergies  Allergen Reactions  . Morphine And Related Itching    Past Medical History:  Diagnosis Date  . 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 Heartburn   . Hypercholesterolemia   . Microscopic hematuria 2009  . Polyp of colon   . Prostate cancer ([place]) 07/27/12   gleason 3+3=6, vol37 ml  . Pulmonary embolism ([facility])    hx  . Thyroid disease    hypothyroid    Family History  Problem Relation  Age of Onset  . Cancer Brother        prostate, completed radiaiton tx 1 year ago  . Colon cancer Neg Hx     Social History   Socioeconomic History  . Marital status: Married    Spouse name: Not on file  . Number of children: Not on file  . Years of education: Not on file  . Highest education level: Not on file  Occupational History  . Not on file  Social Needs  . Financial resource strain: Not on file  . Food insecurity:    Worry: Not on file    Inability: Not on file  . Transportation needs:     Medical: Not on file    Non-medical: Not on file  Tobacco Use  . Smoking status: Former Smoker    Types: Cigarettes    Last attempt to quit: 08/18/1987    Years since quitting: 31.0  . Smokeless tobacco: Never Used  Substance and Sexual Activity  . Alcohol use: No    Alcohol/week: 0.0 standard drinks  . Drug use: No  . Sexual activity: Not on file  Lifestyle  . Physical activity:    Days per week: Not on file    Minutes per session: Not on file  . Stress: Not on file  Relationships  . Social connections:    Talks on phone: Not on file    Gets together: Not on file    Attends religious service: Not on file    Active member of club or organization: Not on file    Attends meetings of clubs or organizations: Not on file    Relationship status: Not on file  . Intimate partner violence:    Fear of current or ex partner: Not on file    Emotionally abused: Not on file    Physically abused: Not on file    Forced sexual activity: Not on file  Other Topics Concern  . Not on file  Social History Narrative  . Not on file    Past Medical History, Surgical history, Social history, and Family history were reviewed and updated as appropriate.   Please see review of systems for further details on the patient's review from today.   Objective:   Physical Exam:  There were no vitals taken for this visit.  Physical Exam Constitutional:      General: He is not in acute distress.    Appearance: He is well-developed.  Musculoskeletal:        General: No deformity.  Neurological:     Mental Status: He is alert and oriented to person, place, and time.     Motor: No tremor.     Coordination: Coordination normal.     Gait: Gait normal.  Psychiatric:        Attention and Perception: Attention normal.        Mood and Affect: Mood is not anxious or depressed. Affect is not labile, blunt, angry or inappropriate.        Speech: Speech normal.        Behavior: Behavior normal.         Thought Content: Thought content normal. Thought content does not include homicidal or suicidal ideation. Thought content does not include homicidal or suicidal plan.        Cognition and Memory: Cognition normal.        Judgment: Judgment normal.     Comments: Insight intact. No auditory or visual hallucinations. No delusions.  Lab Review:  No results found for: NA, K, CL, CO2, GLUCOSE, BUN, CREATININE, CALCIUM, PROT, ALBUMIN, AST, ALT, ALKPHOS, BILITOT, GFRNONAA, GFRAA  No results found for: WBC, RBC, HGB, HCT, PLT, MCV, MCH, MCHC, RDW, LYMPHSABS, MONOABS, EOSABS, BASOSABS  No results found for: POCLITH, LITHIUM   No results found for: PHENYTOIN, PHENOBARB, VALPROATE, CBMZ   .res Assessment: Plan:    Attention deficit hyperactivity disorder (ADHD), combined type  Insomnia due to mental condition - Plan: traZODone (DESYREL) 50 MG tablet, Zolpidem Tartrate (INTERMEZZO) 3.5 MG SUBL   Options gabapentin, Intermezzo, trazodone, mirtazapine, sonata, amitriptyline, Seroquel and disc options and SE each  Options for treating nocturia.  Try trazodone 1 or 2 at night to prevent the awakening.  If it partially works but she needed dosage increase just let us know.  If you have early morning awakening try the intermezzo under your tongue.  Read Marland Kitchen handouts.  This appt was 30 mins.  FU 3 mos  Lynder Parents, MD, DFAPA   Please see After Visit Summary for patient specific instructions.  Future Appointments  Date Time Provider [place]  11/26/2018  2:00 PM Cottle, Billey Co., MD [facility] None    No orders of the defined types were placed in this encounter.     -------------------------------

## 2018-09-03 DIAGNOSIS — M25561 Pain in right knee: Secondary | ICD-10-CM | POA: Diagnosis not present

## 2018-09-03 DIAGNOSIS — R1900 Intra-abdominal and pelvic swelling, mass and lump, unspecified site: Secondary | ICD-10-CM | POA: Diagnosis not present

## 2018-10-07 ENCOUNTER — Encounter: Payer: Self-pay | Admitting: Emergency Medicine

## 2018-10-15 ENCOUNTER — Other Ambulatory Visit: Payer: Self-pay | Admitting: Psychiatry

## 2018-10-15 DIAGNOSIS — F5105 Insomnia due to other mental disorder: Secondary | ICD-10-CM

## 2018-10-23 ENCOUNTER — Other Ambulatory Visit: Payer: Self-pay | Admitting: Psychiatry

## 2018-10-23 DIAGNOSIS — F902 Attention-deficit hyperactivity disorder, combined type: Secondary | ICD-10-CM

## 2018-10-23 MED ORDER — AMPHETAMINE-DEXTROAMPHETAMINE 15 MG PO TABS: 15.0000 mg | ORAL_TABLET | Freq: Three times a day (TID) | ORAL | 0 refills | Status: DC

## 2018-10-23 NOTE — Progress Notes (Signed)
Adderall refill done

## 2018-11-26 ENCOUNTER — Telehealth: Payer: Self-pay | Admitting: Psychiatry

## 2018-11-26 ENCOUNTER — Ambulatory Visit: Payer: 59 | Admitting: Psychiatry

## 2018-11-26 MED ORDER — MIRTAZAPINE 7.5 MG PO TABS: 7.5000 mg | ORAL_TABLET | Freq: Every evening | ORAL | 1 refills | Status: DC | PRN

## 2018-11-26 NOTE — Telephone Encounter (Signed)
RTC  Couldn't make appt bc sick today.  Trazodone for sleep.  Kind of helps.  At 90 doesn't help.  Real dry mouth and bad taste in mouth.  Uses  Sometimes.  Intermezzo 3.5 only if needed.  Guarantees 4 hours of sleep if EMA.  Only used 3 since here.  Still EMA often but gets 4-4.5 hours sleep.  Feels fine when awakens.  Worried over little sleep.  Next best option is low dosage mirtazapine 3.75-15 mg nightly.  Disc SE.Marland Kitchen  He  Agrees.  FU soon.  Lynder Parents, MD, DFAPA

## 2018-12-30 ENCOUNTER — Other Ambulatory Visit: Payer: Self-pay

## 2018-12-30 ENCOUNTER — Telehealth: Payer: Self-pay | Admitting: Psychiatry

## 2018-12-30 DIAGNOSIS — F902 Attention-deficit hyperactivity disorder, combined type: Secondary | ICD-10-CM

## 2018-12-30 MED ORDER — AMPHETAMINE-DEXTROAMPHETAMINE 15 MG PO TABS: 15.0000 mg | ORAL_TABLET | Freq: Three times a day (TID) | ORAL | 0 refills | Status: DC

## 2018-12-30 NOTE — Telephone Encounter (Signed)
Patient need refill on Adderall  90 day supply to be sent to [facility] on [address].

## 2018-12-30 NOTE — Telephone Encounter (Signed)
Submitted for approval

## 2019-01-20 DIAGNOSIS — R21 Rash and other nonspecific skin eruption: Secondary | ICD-10-CM | POA: Diagnosis not present

## 2019-01-21 ENCOUNTER — Telehealth: Payer: Self-pay | Admitting: Psychiatry

## 2019-01-21 ENCOUNTER — Other Ambulatory Visit: Payer: Self-pay

## 2019-01-21 DIAGNOSIS — F902 Attention-deficit hyperactivity disorder, combined type: Secondary | ICD-10-CM

## 2019-01-21 DIAGNOSIS — L309 Dermatitis, unspecified: Secondary | ICD-10-CM | POA: Diagnosis not present

## 2019-01-21 MED ORDER — AMPHETAMINE-DEXTROAMPHETAMINE 15 MG PO TABS: 15.0000 mg | ORAL_TABLET | Freq: Three times a day (TID) | ORAL | 0 refills | Status: DC

## 2019-01-21 NOTE — Telephone Encounter (Signed)
Pended for approval.

## 2019-01-21 NOTE — Telephone Encounter (Signed)
RF Adderall needed to go to [place] please.

## 2019-01-22 ENCOUNTER — Telehealth: Payer: Self-pay | Admitting: Psychiatry

## 2019-01-22 ENCOUNTER — Other Ambulatory Visit: Payer: Self-pay | Admitting: Psychiatry

## 2019-01-22 DIAGNOSIS — R599 Enlarged lymph nodes, unspecified: Secondary | ICD-10-CM | POA: Diagnosis not present

## 2019-01-22 DIAGNOSIS — F902 Attention-deficit hyperactivity disorder, combined type: Secondary | ICD-10-CM

## 2019-01-22 DIAGNOSIS — R21 Rash and other nonspecific skin eruption: Secondary | ICD-10-CM | POA: Diagnosis not present

## 2019-01-22 MED ORDER — LISDEXAMFETAMINE DIMESYLATE 60 MG PO CAPS: 60.0000 mg | ORAL_CAPSULE | ORAL | 0 refills | Status: DC

## 2019-01-22 NOTE — Telephone Encounter (Signed)
Patient called and said that adderrall was sent in instead of his vyvanse 60 mg. [facility] [address]

## 2019-01-22 NOTE — Telephone Encounter (Signed)
Received this message yesterday, rx already submitted.

## 2019-01-22 NOTE — Telephone Encounter (Signed)
Patient need refill on Vyvanse 60 mg., to be [facility] to [facility] on [address] pt is out of medication but he is good on the Adderall

## 2019-02-03 DIAGNOSIS — L989 Disorder of the skin and subcutaneous tissue, unspecified: Secondary | ICD-10-CM | POA: Diagnosis not present

## 2019-02-13 ENCOUNTER — Other Ambulatory Visit: Payer: Self-pay | Admitting: Psychiatry

## 2019-03-04 ENCOUNTER — Other Ambulatory Visit: Payer: Self-pay

## 2019-03-04 ENCOUNTER — Ambulatory Visit (INDEPENDENT_AMBULATORY_CARE_PROVIDER_SITE_OTHER): Payer: 59 | Admitting: Psychiatry

## 2019-03-04 DIAGNOSIS — F902 Attention-deficit hyperactivity disorder, combined type: Secondary | ICD-10-CM | POA: Diagnosis not present

## 2019-03-04 MED ORDER — AMPHETAMINE-DEXTROAMPHETAMINE 15 MG PO TABS: 15.0000 mg | ORAL_TABLET | Freq: Three times a day (TID) | ORAL | 0 refills | Status: DC

## 2019-03-04 MED ORDER — LISDEXAMFETAMINE DIMESYLATE 60 MG PO CAPS: 60.0000 mg | ORAL_CAPSULE | ORAL | 0 refills | Status: DC

## 2019-03-05 ENCOUNTER — Encounter: Payer: Self-pay | Admitting: Psychiatry

## 2019-03-05 NOTE — Progress Notes (Signed)
Tracer Gutridge Mayall [id number] [birth year] 63 y.o.  Subjective:   Patient ID:  Maurice Gomez is a 63 y.o. (DOB 1955-12-28) male.  Chief Complaint:  Chief Complaint  Patient presents with  . ADD    med management    HPI Maurice Gomez presents to the office today for follow-up of ADD.  Increased Vyvanse to 60 mg at last visit.  Problems with sleep.  No problems going. Awaken after 4 hours and difficult to go back to sleep.  Gettting worse and going on for a year.  Misses the lack of sleep. Temazepam inconsistently keeps him asleep and doesn't want to take it all the time DT feeling drugged.  Doesn't believe related to stimulant bc independent of vyvanse use.  Never comfortable when he sleeps. Some pain issues, laying.  Tried new mattress.  Tried sonata at onset but awoke.  Also has to get up to urinate.  Prior sleep meds tried include: Ambien which worked but nervous over the withdrawal effects he had in the past.  Restoril 30, Xanax, Diazepam,  Other previous psychiatric medications include include Ritalin, Concerta 36, Ritalin SR, Daytrana with skin irritation, Vyvanse 60, Adderall, Mydayis, Strattera  Review of Systems:  Review of Systems  Musculoskeletal: Positive for arthralgias, back pain and neck pain.  Psychiatric/Behavioral: Positive for sleep disturbance. Negative for agitation, behavioral problems, confusion, decreased concentration, dysphoric mood, hallucinations, self-injury and suicidal ideas. The patient is not nervous/anxious and is not hyperactive.     Medications: Prior to Admission: (Not in a hospital admission)   Current Outpatient Medications  Medication Sig Dispense Refill  . amphetamine-dextroamphetamine (ADDERALL) 15 MG tablet Take 1 tablet by mouth 3 (three) times daily. 90 tablet 0  . finasteride (PROSCAR) 5 MG tablet Take 5 mg by mouth daily.    Marland Kitchen levothyroxine (SYNTHROID, LEVOTHROID) 100 MCG tablet Take 100 mcg by mouth daily.    Marland Kitchen  lisdexamfetamine (VYVANSE) 60 MG capsule Take 1 capsule (60 mg total) by mouth every morning. 30 capsule 0  . [START ON 04/01/2019] lisdexamfetamine (VYVANSE) 60 MG capsule Take 1 capsule (60 mg total) by mouth every morning. 30 capsule 0  . [START ON 04/29/2019] lisdexamfetamine (VYVANSE) 60 MG capsule Take 1 capsule (60 mg total) by mouth every morning. 30 capsule 0  . lithium carbonate 150 MG capsule Take 150 mg by mouth daily.    . mirtazapine (REMERON) 7.5 MG tablet TAKE 1-2 TABLETS (7.5-15 MG TOTAL) BY MOUTH AT BEDTIME AS NEEDED. 180 tablet 1  . pantoprazole (PROTONIX) 20 MG tablet Take 20 mg by mouth daily.    . Tamsulosin HCl (FLOMAX) 0.4 MG CAPS Take by mouth.    . temazepam (RESTORIL) 30 MG capsule at bedtime as needed.      No current facility-administered medications for this visit.     Medication Side Effects: None  Allergies:  Allergies  Allergen Reactions  . Morphine And Related Itching    Past Medical History:  Diagnosis Date  . 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 Heartburn   . Hypercholesterolemia   . Microscopic hematuria 2009  . Polyp of colon   . Prostate cancer ([place]) 07/27/12   gleason 3+3=6, vol37 ml  . Pulmonary embolism ([facility])    hx  . Thyroid disease    hypothyroid    Family History  Problem Relation Age of Onset  . Cancer Brother        prostate, completed radiaiton tx 1 year ago  .  Colon cancer Neg Hx     Social History   Socioeconomic History  . Marital status: Married    Spouse name: Not on file  . Number of children: Not on file  . Years of education: Not on file  . Highest education level: Not on file  Occupational History  . Not on file  Social Needs  . Financial resource strain: Not on file  . Food insecurity    Worry: Not on file    Inability: Not on file  . Transportation needs    Medical: Not on file    Non-medical: Not on file  Tobacco Use  . Smoking status: Former Smoker    Types: Cigarettes    Quit date:  08/18/1987    Years since quitting: 31.5  . Smokeless tobacco: Never Used  Substance and Sexual Activity  . Alcohol use: No    Alcohol/week: 0.0 standard drinks  . Drug use: No  . Sexual activity: Not on file  Lifestyle  . Physical activity    Days per week: Not on file    Minutes per session: Not on file  . Stress: Not on file  Relationships  . Social Herbalist on phone: Not on file    Gets together: Not on file    Attends religious service: Not on file    Active member of club or organization: Not on file    Attends meetings of clubs or organizations: Not on file    Relationship status: Not on file  . Intimate partner violence    Fear of current or ex partner: Not on file    Emotionally abused: Not on file    Physically abused: Not on file    Forced sexual activity: Not on file  Other Topics Concern  . Not on file  Social History Narrative  . Not on file    Past Medical History, Surgical history, Social history, and Family history were reviewed and updated as appropriate.   Please see review of systems for further details on the patient's review from today.   Objective:   Physical Exam:  There were no vitals taken for this visit.  Physical Exam Constitutional:      General: He is not in acute distress.    Appearance: He is well-developed.  Musculoskeletal:        General: No deformity.  Neurological:     Mental Status: He is alert and oriented to person, place, and time.     Motor: No tremor.     Coordination: Coordination normal.     Gait: Gait normal.  Psychiatric:        Attention and Perception: Attention normal.        Mood and Affect: Mood is not anxious or depressed. Affect is not labile, blunt, angry or inappropriate.        Speech: Speech normal.        Behavior: Behavior normal.        Thought Content: Thought content normal. Thought content does not include homicidal or suicidal ideation. Thought content does not include homicidal or  suicidal plan.        Cognition and Memory: Cognition normal.        Judgment: Judgment normal.     Comments: Insight intact. No auditory or visual hallucinations. No delusions.      Lab Review:  No results found for: NA, K, CL, CO2, GLUCOSE, BUN, CREATININE, CALCIUM, PROT, ALBUMIN, AST, ALT, ALKPHOS, BILITOT, GFRNONAA,  GFRAA  No results found for: WBC, RBC, HGB, HCT, PLT, MCV, MCH, MCHC, RDW, LYMPHSABS, MONOABS, EOSABS, BASOSABS  No results found for: POCLITH, LITHIUM   No results found for: PHENYTOIN, PHENOBARB, VALPROATE, CBMZ   .res Assessment: Plan:    Altonio was seen today for add.  Diagnoses and all orders for this visit:  Attention deficit hyperactivity disorder (ADHD), combined type -     lisdexamfetamine (VYVANSE) 60 MG capsule; Take 1 capsule (60 mg total) by mouth every morning. -     lisdexamfetamine (VYVANSE) 60 MG capsule; Take 1 capsule (60 mg total) by mouth every morning. -     lisdexamfetamine (VYVANSE) 60 MG capsule; Take 1 capsule (60 mg total) by mouth every morning. -     amphetamine-dextroamphetamine (ADDERALL) 15 MG tablet; Take 1 tablet by mouth 3 (three) times daily.    Options gabapentin, Intermezzo, trazodone, mirtazapine, sonata, amitriptyline, Seroquel and disc options and SE each  Options for treating nocturia.  Options for sleep  Discussed potential benefits, risks, and side effects of stimulants with patient to include increased heart rate, palpitations, insomnia, increased anxiety, increased irritability, or decreased appetite.  Instructed patient to contact office if experiencing any significant tolerability issues.  No med changes  If you have early morning awakening try the intermezzo under your tongue.  6 mos Lynder Parents, MD, DFAPA   Please see After Visit Summary for patient specific instructions.  Future Appointments  Date Time Provider [place]  09/02/2019  2:00 PM Cottle, Billey Co., MD [facility] None    No  orders of the defined types were placed in this encounter.     -------------------------------

## 2019-07-26 ENCOUNTER — Telehealth: Payer: Self-pay | Admitting: Psychiatry

## 2019-07-26 ENCOUNTER — Other Ambulatory Visit: Payer: Self-pay

## 2019-07-26 DIAGNOSIS — F902 Attention-deficit hyperactivity disorder, combined type: Secondary | ICD-10-CM

## 2019-07-26 MED ORDER — LISDEXAMFETAMINE DIMESYLATE 60 MG PO CAPS: 60.0000 mg | ORAL_CAPSULE | ORAL | 0 refills | Status: DC

## 2019-07-26 NOTE — Telephone Encounter (Signed)
Patient called and said that he needs a refill on his vyvanse 60 mg [facility] [address]  His next appt is in december

## 2019-07-26 NOTE — Telephone Encounter (Signed)
Last refill 10/02, pended for approval

## 2019-08-08 ENCOUNTER — Emergency Department (HOSPITAL_COMMUNITY)
Admission: EM | Admit: 2019-08-08 | Discharge: 2019-08-09 | Disposition: A | Discharge status: Home / Self Care | Payer: 59 | Attending: Emergency Medicine | Admitting: Emergency Medicine

## 2019-08-08 ENCOUNTER — Other Ambulatory Visit: Payer: Self-pay

## 2019-08-08 ENCOUNTER — Encounter (HOSPITAL_COMMUNITY): Payer: Self-pay | Admitting: Emergency Medicine

## 2019-08-08 DIAGNOSIS — Y939 Activity, unspecified: Secondary | ICD-10-CM | POA: Diagnosis not present

## 2019-08-08 DIAGNOSIS — Y929 Unspecified place or not applicable: Secondary | ICD-10-CM | POA: Insufficient documentation

## 2019-08-08 DIAGNOSIS — S91341A Puncture wound with foreign body, right foot, initial encounter: Secondary | ICD-10-CM | POA: Diagnosis not present

## 2019-08-08 DIAGNOSIS — Z87891 Personal history of nicotine dependence: Secondary | ICD-10-CM | POA: Diagnosis not present

## 2019-08-08 DIAGNOSIS — S99921A Unspecified injury of right foot, initial encounter: Secondary | ICD-10-CM | POA: Diagnosis present

## 2019-08-08 DIAGNOSIS — W458XXA Other foreign body or object entering through skin, initial encounter: Secondary | ICD-10-CM | POA: Diagnosis not present

## 2019-08-08 DIAGNOSIS — Y999 Unspecified external cause status: Secondary | ICD-10-CM | POA: Diagnosis not present

## 2019-08-08 DIAGNOSIS — S8991XA Unspecified injury of right lower leg, initial encounter: Secondary | ICD-10-CM

## 2019-08-08 DIAGNOSIS — Z8546 Personal history of malignant neoplasm of prostate: Secondary | ICD-10-CM | POA: Insufficient documentation

## 2019-08-08 DIAGNOSIS — Z23 Encounter for immunization: Secondary | ICD-10-CM | POA: Insufficient documentation

## 2019-08-08 DIAGNOSIS — Z79899 Other long term (current) drug therapy: Secondary | ICD-10-CM | POA: Insufficient documentation

## 2019-08-08 NOTE — ED Triage Notes (Signed)
Patient here from home with complaints of fish hook in right foot. Bleeding controlled.

## 2019-08-09 ENCOUNTER — Other Ambulatory Visit: Payer: Self-pay

## 2019-08-09 MED ORDER — TETANUS-DIPHTH-ACELL PERTUSSIS 5-2.5-18.5 LF-MCG/0.5 IM SUSP
0.5000 mL | Freq: Once | INTRAMUSCULAR | Status: AC
  Administered 2019-08-09: 0.5 mL via INTRAMUSCULAR
  Filled 2019-08-09: qty 0.5

## 2019-08-09 MED ORDER — LIDOCAINE-EPINEPHRINE (PF) 2 %-1:200000 IJ SOLN
10.0000 mL | Freq: Once | INTRAMUSCULAR | Status: AC
  Administered 2019-08-09: 10 mL
  Filled 2019-08-09: qty 10

## 2019-08-09 NOTE — Discharge Instructions (Addendum)
If you notice any redness, discharge, or fever, please return to the ER or see your doctor.

## 2019-08-09 NOTE — ED Provider Notes (Signed)
[facility] DEPT Provider Note   CSN: [id number] Arrival date & time: 08/08/19  2146     History   Chief Complaint Chief Complaint  Patient presents with  . Foreign Body in Skin  . Foot Pain    HPI Maurice Gomez is a 63 y.o. male.     Patient presents to the emergency department with a chief complaint of fishhook in foot.  He states that he stepped on a fishhook.  It was a clean fishhook.  He was unable to get it out of his foot.  He complains of mild pain.  Last tetanus shot unknown.  Denies any other associated symptoms.  He is not diabetic.  The history is provided by the patient. No language interpreter was used.    Past Medical History:  Diagnosis Date  . 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 Heartburn   . Hypercholesterolemia   . Microscopic hematuria 2009  . Polyp of colon   . Prostate cancer ([place]) 07/27/12   gleason 3+3=6, vol37 ml  . Pulmonary embolism ([facility])    hx  . Thyroid disease    hypothyroid    Patient Active Problem List   Diagnosis Date Noted  . Prostate cancer ([place])   . Polyp of colon   . Hypercholesterolemia   . Thyroid disease   . Pulmonary embolism ([place])   . Microscopic hematuria   . Cystic kidney disease   . Heartburn     Past Surgical History:  Procedure Laterality Date  . ANKLE SURGERY    . APPENDECTOMY    . COLONOSCOPY W/ POLYPECTOMY          Home Medications    Prior to Admission medications   Medication Sig Start Date End Date Taking? Authorizing Provider  amphetamine-dextroamphetamine (ADDERALL) 15 MG tablet Take 1 tablet by mouth 3 (three) times daily. 03/04/19   Cottle, Billey Co., MD  finasteride (PROSCAR) 5 MG tablet Take 5 mg by mouth daily.    [provider]  levothyroxine (SYNTHROID, LEVOTHROID) 100 MCG tablet Take 100 mcg by mouth daily.    [provider]  lisdexamfetamine (VYVANSE) 60 MG capsule Take 1 capsule (60 mg total) by mouth every  morning. 04/01/19   Cottle, Billey Co., MD  lisdexamfetamine (VYVANSE) 60 MG capsule Take 1 capsule (60 mg total) by mouth every morning. 04/29/19   Cottle, Billey Co., MD  lisdexamfetamine (VYVANSE) 60 MG capsule Take 1 capsule (60 mg total) by mouth every morning. 07/26/19   Cottle, Billey Co., MD  lithium carbonate 150 MG capsule Take 150 mg by mouth daily.    [provider]  mirtazapine (REMERON) 7.5 MG tablet TAKE 1-2 TABLETS (7.5-15 MG TOTAL) BY MOUTH AT BEDTIME AS NEEDED. 02/15/19   Cottle, Billey Co., MD  pantoprazole (PROTONIX) 20 MG tablet Take 20 mg by mouth daily.    [provider]  Tamsulosin HCl (FLOMAX) 0.4 MG CAPS Take by mouth.    [provider]  temazepam (RESTORIL) 30 MG capsule at bedtime as needed.  07/27/12   [provider]    Family History Family History  Problem Relation Age of Onset  . Cancer Brother        prostate, completed radiaiton tx 1 year ago  . Colon cancer Neg Hx     Social History Social History   Tobacco Use  . Smoking status: Former Smoker    Types: Cigarettes  Quit date: 08/18/1987    Years since quitting: 31.9  . Smokeless tobacco: Never Used  Substance Use Topics  . Alcohol use: No    Alcohol/week: 0.0 standard drinks  . Drug use: No     Allergies   Morphine and related   Review of Systems Review of Systems  All other systems reviewed and are negative.    Physical Exam Updated Vital Signs BP (!) 187/96 (BP Location: Left Arm)   Pulse 88   Temp 98 F (36.7 C) (Oral)   Resp 18   Ht 5\' 10"  (1.778 m)   Wt 102.1 kg   SpO2 96%   BMI 32.28 kg/m   Physical Exam Vitals signs and nursing note reviewed.  Constitutional:      General: He is not in acute distress.    Appearance: He is well-developed. He is not ill-appearing.  HENT:     Head: Normocephalic and atraumatic.  Eyes:     Conjunctiva/sclera: Conjunctivae normal.  Neck:     Musculoskeletal: Neck supple.  Cardiovascular:      Rate and Rhythm: Normal rate.  Pulmonary:     Effort: Pulmonary effort is normal. No respiratory distress.  Abdominal:     General: There is no distension.  Musculoskeletal:     Comments: Moves all extremities  Skin:    General: Skin is warm and dry.     Comments: Fish hook in right foot, plantar surface  Neurological:     Mental Status: He is alert and oriented to person, place, and time.  Psychiatric:        Mood and Affect: Mood normal.        Behavior: Behavior normal.      ED Treatments / Results  Labs (all labs ordered are listed, but only abnormal results are displayed) Labs Reviewed - No data to display  EKG None  Radiology No results found.  Procedures .Foreign Body Removal  Date/Time: 08/09/2019 1:21 AM Performed by: Montine Circle, PA-C Authorized by: Montine Circle, PA-C  Consent: Verbal consent obtained. Risks and benefits: risks, benefits and alternatives were discussed Consent given by: patient Patient understanding: patient states understanding of the procedure being performed Patient consent: the patient's understanding of the procedure matches consent given Procedure consent: procedure consent matches procedure scheduled Relevant documents: relevant documents present and verified Test results: test results available and properly labeled Site marked: the operative site was marked Imaging studies: imaging studies available Required items: required blood products, implants, devices, and special equipment available Patient identity confirmed: verbally with patient Time out: Immediately prior to procedure a "time out" was called to verify the correct patient, procedure, equipment, support staff and site/side marked as required. Body area: skin General location: lower extremity Location details: right foot Anesthesia: local infiltration  Sedation: Patient sedated: no  Patient restrained: no Patient cooperative: yes Localization method:  visualized Removal mechanism: forceps Dressing: antibiotic ointment and dressing applied Tendon involvement: none Depth: subcutaneous Complexity: simple 1 objects recovered. Objects recovered: fish hook Post-procedure assessment: foreign body removed   (including critical care time)  Medications Ordered in ED Medications  lidocaine-EPINEPHrine (XYLOCAINE W/EPI) 2 %-1:200000 (PF) injection 10 mL (has no administration in time range)  Tdap (BOOSTRIX) injection 0.5 mL (has no administration in time range)     Initial Impression / Assessment and Plan / ED Course  I have reviewed the triage vital signs and the nursing notes.  Pertinent labs & imaging results that were available during my care of the patient  were reviewed by me and considered in my medical decision making (see chart for details).        Patient with fishhook in right foot.  This was removed by me.  Tetanus shot updated.  I think he is low risk for infection given that he is not diabetic, and the fishhook was clean/new, and it did not penetrate through his shoe, but rather just into barefoot.  Final Clinical Impressions(s) / ED Diagnoses   Final diagnoses:  Fish hook injury of right lower leg, initial encounter    ED Discharge Orders    None       Montine Circle, PA-C 08/09/19 0122    Veryl Speak, MD 08/09/19 [phone]

## 2019-08-09 NOTE — ED Notes (Signed)
Wound care completed. Wound cleansed with betadine. Bandage applied.

## 2019-09-02 ENCOUNTER — Other Ambulatory Visit: Payer: Self-pay

## 2019-09-02 ENCOUNTER — Encounter: Payer: Self-pay | Admitting: Psychiatry

## 2019-09-02 ENCOUNTER — Ambulatory Visit (INDEPENDENT_AMBULATORY_CARE_PROVIDER_SITE_OTHER): Payer: 59 | Admitting: Psychiatry

## 2019-09-02 VITALS — BP 182/96 | HR 74

## 2019-09-02 DIAGNOSIS — F5105 Insomnia due to other mental disorder: Secondary | ICD-10-CM

## 2019-09-02 DIAGNOSIS — F902 Attention-deficit hyperactivity disorder, combined type: Secondary | ICD-10-CM

## 2019-09-02 MED ORDER — LISDEXAMFETAMINE DIMESYLATE 60 MG PO CAPS: 60.0000 mg | ORAL_CAPSULE | ORAL | 0 refills | Status: DC

## 2019-09-02 MED ORDER — AMPHETAMINE-DEXTROAMPHETAMINE 15 MG PO TABS: 15.0000 mg | ORAL_TABLET | Freq: Three times a day (TID) | ORAL | 0 refills | Status: DC

## 2019-09-02 NOTE — Progress Notes (Signed)
Maurice Gomez [id number] 63 y.o.  Subjective:   Patient ID:  Maurice Gomez is a 63 y.o. (DOB [birth year]) male.  Chief Complaint:  Chief Complaint  Patient presents with  . Follow-up    Medication Management  . ADHD    Medication Management  . Medication Refill    Vyvanse    HPI Maurice Gomez presents to the office today for follow-up of ADD.    Increased Vyvanse to 60 mg 2019.  Last seen March 04, 2019.  No meds were changed.  He continued Vyvanse 60 mg daily.  Oral days when he forgot Vyvanse he would take Adderall. Also variety of tx for sleep have been used.  Working long hours.  Busy but OK.  Maurice Gomez still sleeps until noon and has had some heart issues.  She's had MI.    Sleep is better but not great.   4 to 4& 1/2 hours sleep and not tired. Vyvanse only lasts about 6 hours now.   Problems with sleep.  No problems going. Awaken after 4 hours and difficult to go back to sleep.  Gettting worse and going on for a year.  Misses the lack of sleep. Temazepam inconsistently keeps him asleep and doesn't want to take it all the time DT feeling drugged.  Doesn't believe related to stimulant bc independent of vyvanse use.  Never comfortable when he sleeps. Some pain issues, laying.  Tried new mattress.  Tried sonata at onset but awoke.  Also has to get up to urinate.  Prior sleep meds tried include: Ambien which worked but nervous over the withdrawal effects he had in the past.  Restoril 30, Xanax, Diazepam, trazodone hangover,  Sonata brief Other previous psychiatric medications include include Ritalin, Concerta 36, Ritalin SR, Daytrana with skin irritation, Vyvanse 60, Adderall, Mydayis, Strattera  Review of Systems:  Review of Systems  Musculoskeletal: Positive for arthralgias, back pain and neck pain.  Psychiatric/Behavioral: Positive for sleep disturbance. Negative for agitation, behavioral problems, confusion, decreased concentration, dysphoric  mood, hallucinations, self-injury and suicidal ideas. The patient is not nervous/anxious and is not hyperactive.     Medications: Prior to Admission: (Not in a hospital admission)   Current Outpatient Medications  Medication Sig Dispense Refill  . amphetamine-dextroamphetamine (ADDERALL) 15 MG tablet Take 1 tablet by mouth 3 (three) times daily. 90 tablet 0  . levothyroxine (SYNTHROID) 112 MCG tablet Take 112 mcg by mouth every morning.    . lisdexamfetamine (VYVANSE) 60 MG capsule Take 1 capsule (60 mg total) by mouth every morning. 30 capsule 0  . lisdexamfetamine (VYVANSE) 60 MG capsule Take 1 capsule (60 mg total) by mouth every morning. 30 capsule 0  . lisdexamfetamine (VYVANSE) 60 MG capsule Take 1 capsule (60 mg total) by mouth every morning. 30 capsule 0  . pantoprazole (PROTONIX) 20 MG tablet Take 20 mg by mouth daily.    . Tamsulosin HCl (FLOMAX) 0.4 MG CAPS Take by mouth.     No current facility-administered medications for this visit.    Medication Side Effects: None  Allergies:  Allergies  Allergen Reactions  . Morphine And Related Itching    Past Medical History:  Diagnosis Date  . 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 Heartburn   . Hypercholesterolemia   . Microscopic hematuria 2009  . Polyp of colon   . Prostate cancer ([place]) 07/27/12   gleason 3+3=6, vol37 ml  . Pulmonary embolism ([facility])    hx  .  Thyroid disease    hypothyroid    Family History  Problem Relation Age of Onset  . Cancer Brother        prostate, completed radiaiton tx 1 year ago  . Colon cancer Neg Hx     Social History   Socioeconomic History  . Marital status: Married    Spouse name: Not on file  . Number of children: Not on file  . Years of education: Not on file  . Highest education level: Not on file  Occupational History  . Not on file  Tobacco Use  . Smoking status: Former Smoker    Types: Cigarettes    Quit date: 08/18/1987    Years since quitting: 32.0   . Smokeless tobacco: Never Used  Substance and Sexual Activity  . Alcohol use: No    Alcohol/week: 0.0 standard drinks  . Drug use: No  . Sexual activity: Not on file  Other Topics Concern  . Not on file  Social History Narrative  . Not on file   Social Determinants of Health   Financial Resource Strain:   . Difficulty of Paying Living Expenses: Not on file  Food Insecurity:   . Worried About Charity fundraiser in the Last Year: Not on file  . Ran Out of Food in the Last Year: Not on file  Transportation Needs:   . Lack of Transportation (Medical): Not on file  . Lack of Transportation (Non-Medical): Not on file  Physical Activity:   . Days of Exercise per Week: Not on file  . Minutes of Exercise per Session: Not on file  Stress:   . Feeling of Stress : Not on file  Social Connections:   . Frequency of Communication with Friends and Family: Not on file  . Frequency of Social Gatherings with Friends and Family: Not on file  . Attends Religious Services: Not on file  . Active Member of Clubs or Organizations: Not on file  . Attends Archivist Meetings: Not on file  . Marital Status: Not on file  Intimate Partner Violence:   . Fear of Current or Ex-Partner: Not on file  . Emotionally Abused: Not on file  . Physically Abused: Not on file  . Sexually Abused: Not on file    Past Medical History, Surgical history, Social history, and Family history were reviewed and updated as appropriate.   Please see review of systems for further details on the patient's review from today.   Objective:   Physical Exam:  BP (!) 182/96   Pulse 74   Physical Exam Constitutional:      General: He is not in acute distress.    Appearance: He is well-developed.  Musculoskeletal:        General: No deformity.  Neurological:     Mental Status: He is alert and oriented to person, place, and time.     Motor: No tremor.     Coordination: Coordination normal.     Gait: Gait  normal.  Psychiatric:        Attention and Perception: Attention normal.        Mood and Affect: Mood is not anxious or depressed. Affect is not labile, blunt, angry or inappropriate.        Speech: Speech normal.        Behavior: Behavior normal.        Thought Content: Thought content normal. Thought content does not include homicidal or suicidal ideation. Thought content does not include  homicidal or suicidal plan.        Cognition and Memory: Cognition normal.        Judgment: Judgment normal.     Comments: Insight intact. No auditory or visual hallucinations. No delusions.      Lab Review:  No results found for: NA, K, CL, CO2, GLUCOSE, BUN, CREATININE, CALCIUM, PROT, ALBUMIN, AST, ALT, ALKPHOS, BILITOT, GFRNONAA, GFRAA  No results found for: WBC, RBC, HGB, HCT, PLT, MCV, MCH, MCHC, RDW, LYMPHSABS, MONOABS, EOSABS, BASOSABS  No results found for: POCLITH, LITHIUM   No results found for: PHENYTOIN, PHENOBARB, VALPROATE, CBMZ   .res Assessment: Plan:    Jacieon was seen today for follow-up, adhd and medication refill.  Diagnoses and all orders for this visit:  Attention deficit hyperactivity disorder (ADHD), combined type  Insomnia due to mental condition   Options gabapentin, Intermezzo, trazodone, mirtazapine, sonata, amitriptyline, Seroquel and disc options and SE each  Options for treating nocturia.  Options for sleep  Discussed potential benefits, risks, and side effects of stimulants with patient to include increased heart rate, palpitations, insomnia, increased anxiety, increased irritability, or decreased appetite.  Instructed patient to contact office if experiencing any significant tolerability issues.  No med changes  If you have early morning awakening try the intermezzo under your tongue.  6 mos Lynder Parents, MD, DFAPA   Please see After Visit Summary for patient specific instructions.  No future appointments.  No orders of the defined types  were placed in this encounter.     -------------------------------

## 2019-12-06 ENCOUNTER — Other Ambulatory Visit: Payer: Self-pay | Admitting: Urology

## 2019-12-06 DIAGNOSIS — C61 Malignant neoplasm of prostate: Secondary | ICD-10-CM

## 2019-12-20 ENCOUNTER — Telehealth: Payer: Self-pay | Admitting: Psychiatry

## 2019-12-20 ENCOUNTER — Other Ambulatory Visit: Payer: Self-pay

## 2019-12-20 DIAGNOSIS — F902 Attention-deficit hyperactivity disorder, combined type: Secondary | ICD-10-CM

## 2019-12-20 MED ORDER — LISDEXAMFETAMINE DIMESYLATE 60 MG PO CAPS: 60.0000 mg | ORAL_CAPSULE | ORAL | 0 refills | Status: DC

## 2019-12-20 MED ORDER — AMPHETAMINE-DEXTROAMPHETAMINE 15 MG PO TABS: 15.0000 mg | ORAL_TABLET | Freq: Three times a day (TID) | ORAL | 0 refills | Status: DC

## 2019-12-20 NOTE — Telephone Encounter (Signed)
Pt requesting refill fro Vyvanse 3 mos and Adderall 15 mg 3/d @ [facility]. Next appt 6/17

## 2019-12-20 NOTE — Telephone Encounter (Signed)
Last refill Vyvanse 11/15/2019, pended 3 Rx's for Dr. Clovis Pu to submit Last refill Adderall 08/2019, pended 1 Rx for Dr. Clovis Pu Has apt 03/02/2020

## 2020-01-05 ENCOUNTER — Ambulatory Visit
Admission: RE | Admit: 2020-01-05 | Discharge: 2020-01-05 | Disposition: A | Discharge status: Home / Self Care | Payer: 59 | Source: Ambulatory Visit | Attending: Urology | Admitting: Urology

## 2020-01-05 DIAGNOSIS — C61 Malignant neoplasm of prostate: Secondary | ICD-10-CM

## 2020-01-05 MED ORDER — GADOBENATE DIMEGLUMINE 529 MG/ML IV SOLN
20.0000 mL | Freq: Once | INTRAVENOUS | Status: AC | PRN
  Administered 2020-01-05: 20 mL via INTRAVENOUS

## 2020-03-02 ENCOUNTER — Encounter: Payer: Self-pay | Admitting: Psychiatry

## 2020-03-02 ENCOUNTER — Other Ambulatory Visit: Payer: Self-pay

## 2020-03-02 ENCOUNTER — Ambulatory Visit (INDEPENDENT_AMBULATORY_CARE_PROVIDER_SITE_OTHER): Payer: 59 | Admitting: Psychiatry

## 2020-03-02 DIAGNOSIS — F5105 Insomnia due to other mental disorder: Secondary | ICD-10-CM | POA: Diagnosis not present

## 2020-03-02 DIAGNOSIS — F902 Attention-deficit hyperactivity disorder, combined type: Secondary | ICD-10-CM

## 2020-03-02 MED ORDER — MYDAYIS 50 MG PO CP24: 1.0000 | ORAL_CAPSULE | Freq: Every morning | ORAL | 0 refills | Status: DC

## 2020-03-02 MED ORDER — LISDEXAMFETAMINE DIMESYLATE 60 MG PO CAPS: 60.0000 mg | ORAL_CAPSULE | ORAL | 0 refills | Status: DC

## 2020-03-02 MED ORDER — AMPHETAMINE-DEXTROAMPHETAMINE 15 MG PO TABS: 15.0000 mg | ORAL_TABLET | Freq: Three times a day (TID) | ORAL | 0 refills | Status: DC

## 2020-03-02 NOTE — Progress Notes (Signed)
Mercer Peifer Yaklin [id number] 64 y.o.  Subjective:   Patient ID:  Maurice Gomez is a 64 y.o. (DOB January 01, 1956) male.  Chief Complaint:  Chief Complaint  Patient presents with  . ADHD  . Follow-up    HPI Maurice Gomez presents to the office today for follow-up of ADD.    Increased Vyvanse to 60 mg 2019.  Last seen March 04, 2019.  No meds were changed.  He continued Vyvanse 60 mg daily.  Oral days when he forgot Vyvanse he would take Adderall. Also variety of tx for sleep have been used.  Working long hours.  Busy but OK.  Maurice Gomez still sleeps until noon and has had some heart issues.  She's had MI.    Sleep is better but not great.   4 to 4& 1/2 hours sleep and not tired. Vyvanse only lasts about 6 hours now.   Problems with sleep.  No problems going. Awaken after 4 hours and difficult to go back to sleep.  Gettting worse and going on for a year.  Misses the lack of sleep. Temazepam inconsistently keeps him asleep and doesn't want to take it all the time DT feeling drugged.  Doesn't believe related to stimulant bc independent of vyvanse use.  Never comfortable when he sleeps. Some pain issues, laying.  Tried new mattress.  Tried sonata at onset but awoke.  Also has to get up to urinate.  Prior sleep meds tried include: Ambien which worked but nervous over the withdrawal effects he had in the past.  Restoril 30, Xanax, Diazepam, trazodone hangover,  Sonata brief  Other previous psychiatric medications include include Ritalin, Concerta 36, Ritalin SR, Daytrana with skin irritation, Vyvanse 60, Adderall, Mydayis never tried, Strattera  Review of Systems:  Review of Systems  Genitourinary: Positive for difficulty urinating.  Musculoskeletal: Positive for arthralgias and back pain. Negative for neck pain.  Psychiatric/Behavioral: Positive for sleep disturbance. Negative for agitation, behavioral problems, confusion, decreased concentration, dysphoric mood,  hallucinations, self-injury and suicidal ideas. The patient is not nervous/anxious and is not hyperactive.     Medications: Prior to Admission: (Not in a hospital admission)   Current Outpatient Medications  Medication Sig Dispense Refill  . amphetamine-dextroamphetamine (ADDERALL) 15 MG tablet Take 1 tablet by mouth 3 (three) times daily. 90 tablet 0  . levothyroxine (SYNTHROID) 112 MCG tablet Take 112 mcg by mouth every morning.    . lisdexamfetamine (VYVANSE) 60 MG capsule Take 1 capsule (60 mg total) by mouth every morning. 30 capsule 0  . lisdexamfetamine (VYVANSE) 60 MG capsule Take 1 capsule (60 mg total) by mouth every morning. 30 capsule 0  . lisdexamfetamine (VYVANSE) 60 MG capsule Take 1 capsule (60 mg total) by mouth every morning. 30 capsule 0  . pantoprazole (PROTONIX) 20 MG tablet Take 20 mg by mouth daily.    . Tamsulosin HCl (FLOMAX) 0.4 MG CAPS Take by mouth.    . Amphet-Dextroamphet 3-Bead ER (MYDAYIS) 50 MG CP24 Take 1 capsule by mouth in the morning. 30 capsule 0   No current facility-administered medications for this visit.    Medication Side Effects: None  Allergies:  Allergies  Allergen Reactions  . Morphine And Related Itching    Past Medical History:  Diagnosis Date  . 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 Heartburn   . Hypercholesterolemia   . Microscopic hematuria 2009  . Polyp of colon   . Prostate cancer ([place]) 07/27/12   gleason 3+3=6,  vol37 ml  . Pulmonary embolism ([facility])    hx  . Thyroid disease    hypothyroid    Family History  Problem Relation Age of Onset  . Cancer Brother        prostate, completed radiaiton tx 1 year ago  . Colon cancer Neg Hx     Social History   Socioeconomic History  . Marital status: Married    Spouse name: Not on file  . Number of children: Not on file  . Years of education: Not on file  . Highest education level: Not on file  Occupational History  . Not on file  Tobacco Use  . Smoking  status: Former Smoker    Types: Cigarettes    Quit date: 08/18/1987    Years since quitting: 32.5  . Smokeless tobacco: Never Used  Substance and Sexual Activity  . Alcohol use: No    Alcohol/week: 0.0 standard drinks  . Drug use: No  . Sexual activity: Not on file  Other Topics Concern  . Not on file  Social History Narrative  . Not on file   Social Determinants of Health   Financial Resource Strain:   . Difficulty of Paying Living Expenses:   Food Insecurity:   . Worried About Charity fundraiser in the Last Year:   . Arboriculturist in the Last Year:   Transportation Needs:   . Film/video editor (Medical):   Marland Kitchen Lack of Transportation (Non-Medical):   Physical Activity:   . Days of Exercise per Week:   . Minutes of Exercise per Session:   Stress:   . Feeling of Stress :   Social Connections:   . Frequency of Communication with Friends and Family:   . Frequency of Social Gatherings with Friends and Family:   . Attends Religious Services:   . Active Member of Clubs or Organizations:   . Attends Archivist Meetings:   Marland Kitchen Marital Status:   Intimate Partner Violence:   . Fear of Current or Ex-Partner:   . Emotionally Abused:   Marland Kitchen Physically Abused:   . Sexually Abused:     Past Medical History, Surgical history, Social history, and Family history were reviewed and updated as appropriate.   Please see review of systems for further details on the patient's review from today.   Objective:   Physical Exam:  There were no vitals taken for this visit.  Physical Exam Constitutional:      General: He is not in acute distress.    Appearance: He is well-developed.  Musculoskeletal:        General: No deformity.  Neurological:     Mental Status: He is alert and oriented to person, place, and time.     Motor: No tremor.     Coordination: Coordination normal.     Gait: Gait normal.  Psychiatric:        Attention and Perception: Attention normal.         Mood and Affect: Mood is not anxious or depressed. Affect is not labile, blunt, angry or inappropriate.        Speech: Speech normal.        Behavior: Behavior normal.        Thought Content: Thought content normal. Thought content does not include homicidal or suicidal ideation. Thought content does not include homicidal or suicidal plan.        Cognition and Memory: Cognition normal.  Judgment: Judgment normal.     Comments: Insight intact. No auditory or visual hallucinations. No delusions.      Lab Review:  No results found for: NA, K, CL, CO2, GLUCOSE, BUN, CREATININE, CALCIUM, PROT, ALBUMIN, AST, ALT, ALKPHOS, BILITOT, GFRNONAA, GFRAA  No results found for: WBC, RBC, HGB, HCT, PLT, MCV, MCH, MCHC, RDW, LYMPHSABS, MONOABS, EOSABS, BASOSABS  No results found for: POCLITH, LITHIUM   No results found for: PHENYTOIN, PHENOBARB, VALPROATE, CBMZ   .res Assessment: Plan:    Maurice Gomez was seen today for adhd and follow-up.  Diagnoses and all orders for this visit:  Attention deficit hyperactivity disorder (ADHD), combined type -     lisdexamfetamine (VYVANSE) 60 MG capsule; Take 1 capsule (60 mg total) by mouth every morning. -     amphetamine-dextroamphetamine (ADDERALL) 15 MG tablet; Take 1 tablet by mouth 3 (three) times daily. -     Amphet-Dextroamphet 3-Bead ER (MYDAYIS) 50 MG CP24; Take 1 capsule by mouth in the morning.  Insomnia due to mental condition   Options gabapentin, Intermezzo, trazodone, mirtazapine, sonata, amitriptyline, Seroquel and disc options and SE each  Options for treating nocturia.  Options for sleep  Discussed potential benefits, risks, and side effects of stimulants with patient to include increased heart rate, palpitations, insomnia, increased anxiety, increased irritability, or decreased appetite.  Instructed patient to contact office if experiencing any significant tolerability issues.  Try Mydayis 50 bc inadequate duration from  Vyvanse. Otherwise using Adderall in Am and Vyvanse 60 at noon.  6 mos  Lynder Parents, MD, DFAPA   Please see After Visit Summary for patient specific instructions.  No future appointments.  No orders of the defined types were placed in this encounter.     -------------------------------

## 2020-04-06 ENCOUNTER — Other Ambulatory Visit: Payer: Self-pay

## 2020-04-06 ENCOUNTER — Telehealth: Payer: Self-pay | Admitting: Psychiatry

## 2020-04-06 DIAGNOSIS — F902 Attention-deficit hyperactivity disorder, combined type: Secondary | ICD-10-CM

## 2020-04-06 MED ORDER — LISDEXAMFETAMINE DIMESYLATE 60 MG PO CAPS: 60.0000 mg | ORAL_CAPSULE | ORAL | 0 refills | Status: DC

## 2020-04-06 NOTE — Telephone Encounter (Signed)
Rx pended for Dr. Clovis Pu to submit

## 2020-04-06 NOTE — Telephone Encounter (Signed)
Maurice Gomez called because he is travelling in [place] and needs a refill of his Vyvanse sent to a pharmacy there.  [facility] [place]  Please send a prescription to [facility] [place], [place] [address].  [phone]

## 2020-06-13 ENCOUNTER — Other Ambulatory Visit: Payer: Self-pay

## 2020-06-13 ENCOUNTER — Telehealth: Payer: Self-pay | Admitting: Psychiatry

## 2020-06-13 ENCOUNTER — Other Ambulatory Visit: Payer: Self-pay | Admitting: Psychiatry

## 2020-06-13 DIAGNOSIS — F902 Attention-deficit hyperactivity disorder, combined type: Secondary | ICD-10-CM

## 2020-06-13 MED ORDER — LISDEXAMFETAMINE DIMESYLATE 60 MG PO CAPS: 60.0000 mg | ORAL_CAPSULE | ORAL | 0 refills | Status: DC

## 2020-06-13 MED ORDER — AMPHETAMINE-DEXTROAMPHETAMINE 15 MG PO TABS: 15.0000 mg | ORAL_TABLET | Freq: Three times a day (TID) | ORAL | 0 refills | Status: DC

## 2020-06-13 NOTE — Telephone Encounter (Signed)
Rx's pended [facility]

## 2020-06-13 NOTE — Telephone Encounter (Signed)
Maurice Gomez called to request refill of his Vyvanse and Adderall.  appt 12/15.  [facility] [address]

## 2020-08-30 ENCOUNTER — Ambulatory Visit: Payer: 59 | Admitting: Psychiatry

## 2020-10-23 ENCOUNTER — Other Ambulatory Visit: Payer: Self-pay

## 2020-10-23 ENCOUNTER — Encounter: Payer: Self-pay | Admitting: Psychiatry

## 2020-10-23 ENCOUNTER — Ambulatory Visit (INDEPENDENT_AMBULATORY_CARE_PROVIDER_SITE_OTHER): Payer: 59 | Admitting: Psychiatry

## 2020-10-23 VITALS — BP 155/92 | HR 74

## 2020-10-23 DIAGNOSIS — F902 Attention-deficit hyperactivity disorder, combined type: Secondary | ICD-10-CM | POA: Diagnosis not present

## 2020-10-23 DIAGNOSIS — F5105 Insomnia due to other mental disorder: Secondary | ICD-10-CM | POA: Diagnosis not present

## 2020-10-23 DIAGNOSIS — I10 Essential (primary) hypertension: Secondary | ICD-10-CM | POA: Diagnosis not present

## 2020-10-23 MED ORDER — LOSARTAN POTASSIUM 50 MG PO TABS: 50.0000 mg | ORAL_TABLET | Freq: Every day | ORAL | 1 refills | Status: DC

## 2020-10-23 MED ORDER — MYDAYIS 37.5 MG PO CP24: 1.0000 | ORAL_CAPSULE | Freq: Every morning | ORAL | 0 refills | Status: DC

## 2020-10-23 MED ORDER — AMPHETAMINE-DEXTROAMPHETAMINE 15 MG PO TABS: 15.0000 mg | ORAL_TABLET | Freq: Three times a day (TID) | ORAL | 0 refills | Status: DC

## 2020-10-23 NOTE — Progress Notes (Signed)
Maurice Gomez [id number] 65 y.o.  Subjective:   Patient ID:  Maurice Gomez is a 65 y.o. (DOB 05-15-1956) male.  Chief Complaint:  Chief Complaint  Patient presents with  . Attention deficit hyperactivity disorder (ADHD), combined t  . Follow-up  . Sleeping Problem    HPI Maurice Gomez presents to the office today for follow-up of ADD.    Increased Vyvanse to 60 mg 2019.  seen March 04, 2019.  No meds were changed.  He continued Vyvanse 60 mg daily.  Oral days when he forgot Vyvanse he would take Adderall. Also variety of tx for sleep have been used.  02/2020 appt noted: Working long hours.  Busy but OK.  Maurice Gomez still sleeps until noon and has had some heart issues.  She's had MI.   Sleep is better but not great.   4 to 4& 1/2 hours sleep and not tired. Vyvanse only lasts about 6 hours now.  Problems with sleep.  No problems going. Awaken after 4 hours and difficult to go back to sleep.  Gettting worse and going on for a year.  Misses the lack of sleep. Temazepam inconsistently keeps him asleep and doesn't want to take it all the time DT feeling drugged.  Doesn't believe related to stimulant bc independent of vyvanse use.  Never comfortable when he sleeps. Some pain issues, laying.  Tried new mattress.  Tried sonata at onset but awoke.  Also has to get up to urinate. Plan: Try Mydayis 50 bc inadequate duration from Vyvanse. Otherwise using Adderall in Am and Vyvanse 60 at noon.  10/23/2020 appointment with the following noted: Mydayis never tried.   Been a little worried about weight and BP.  BP got elevated with weight gain. BP usually 140-150/85-95. Dx OSA AHI 36 and can't get CPAP for 3 mos DT chip shortage.  Loud snoring and nonrestorative sleep and fatigue ADD would like longer duration. Patient reports stable mood and denies depressed or irritable moods.  Patient denies any recent difficulty with anxiety. Denies appetite disturbance.  Patient  reports that energy and motivation have been good.    Patient denies any suicidal ideation.  Prior sleep meds tried include: Ambien which worked but nervous over the withdrawal effects he had in the past.  Restoril 30, Xanax, Diazepam, trazodone hangover,  Sonata brief  Other previous psychiatric medications include include Ritalin, Concerta 36, Ritalin SR, Daytrana with skin irritation, Vyvanse 60, Adderall, Mydayis never tried, Strattera  Review of Systems:  Review of Systems  Constitutional: Positive for fatigue.  Genitourinary: Positive for difficulty urinating.  Musculoskeletal: Positive for arthralgias and back pain. Negative for neck pain.  Psychiatric/Behavioral: Positive for sleep disturbance. Negative for agitation, behavioral problems, confusion, decreased concentration, dysphoric mood, hallucinations, self-injury and suicidal ideas. The patient is not nervous/anxious and is not hyperactive.     Medications: Prior to Admission: (Not in a hospital admission)   Current Outpatient Medications  Medication Sig Dispense Refill  . Amphet-Dextroamphet 3-Bead ER (MYDAYIS) 37.5 MG CP24 Take 1 capsule by mouth in the morning. 30 capsule 0  . levothyroxine (SYNTHROID) 112 MCG tablet Take 112 mcg by mouth every morning.    . lisdexamfetamine (VYVANSE) 60 MG capsule Take 1 capsule (60 mg total) by mouth every morning. 30 capsule 0  . losartan (COZAAR) 50 MG tablet Take 1 tablet (50 mg total) by mouth daily. 30 tablet 1  . pantoprazole (PROTONIX) 20 MG tablet Take 20 mg by mouth daily.    Marland Kitchen  Tamsulosin HCl (FLOMAX) 0.4 MG CAPS Take by mouth at bedtime.    . Amphet-Dextroamphet 3-Bead ER (MYDAYIS) 50 MG CP24 Take 1 capsule by mouth in the morning. (Patient not taking: Reported on 10/23/2020) 30 capsule 0  . amphetamine-dextroamphetamine (ADDERALL) 15 MG tablet Take 1 tablet by mouth 3 (three) times daily. 90 tablet 0   No current facility-administered medications for this visit.    Medication  Side Effects: None  Allergies:  Allergies  Allergen Reactions  . Morphine And Related Itching    Past Medical History:  Diagnosis Date  . 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 Heartburn   . Hypercholesterolemia   . Microscopic hematuria 2009  . Polyp of colon   . Prostate cancer ([place]) 07/27/12   gleason 3+3=6, vol37 ml  . Pulmonary embolism ([facility])    hx  . Thyroid disease    hypothyroid    Family History  Problem Relation Age of Onset  . Cancer Brother        prostate, completed radiaiton tx 1 year ago  . Colon cancer Neg Hx     Social History   Socioeconomic History  . Marital status: Married    Spouse name: Not on file  . Number of children: Not on file  . Years of education: Not on file  . Highest education level: Not on file  Occupational History  . Not on file  Tobacco Use  . Smoking status: Former Smoker    Types: Cigarettes    Quit date: 08/18/1987    Years since quitting: 33.2  . Smokeless tobacco: Never Used  Substance and Sexual Activity  . Alcohol use: No    Alcohol/week: 0.0 standard drinks  . Drug use: No  . Sexual activity: Not on file  Other Topics Concern  . Not on file  Social History Narrative  . Not on file   Social Determinants of Health   Financial Resource Strain: Not on file  Food Insecurity: Not on file  Transportation Needs: Not on file  Physical Activity: Not on file  Stress: Not on file  Social Connections: Not on file  Intimate Partner Violence: Not on file    Past Medical History, Surgical history, Social history, and Family history were reviewed and updated as appropriate.   Please see review of systems for further details on the patient's review from today.   Objective:   Physical Exam:  BP (!) 155/92   Pulse 74   Physical Exam Constitutional:      General: He is not in acute distress.    Appearance: He is well-developed. He is obese.  Musculoskeletal:        General: No deformity.   Neurological:     Mental Status: He is alert and oriented to person, place, and time.     Motor: No tremor.     Coordination: Coordination normal.     Gait: Gait normal.  Psychiatric:        Attention and Perception: Attention normal.        Mood and Affect: Mood is not anxious or depressed. Affect is not labile, blunt, angry or inappropriate.        Speech: Speech normal.        Behavior: Behavior normal.        Thought Content: Thought content normal. Thought content does not include homicidal or suicidal ideation. Thought content does not include homicidal or suicidal plan.  Cognition and Memory: Cognition normal.        Judgment: Judgment normal.     Comments: Insight intact. No auditory or visual hallucinations. No delusions.      Lab Review:  No results found for: NA, K, CL, CO2, GLUCOSE, BUN, CREATININE, CALCIUM, PROT, ALBUMIN, AST, ALT, ALKPHOS, BILITOT, GFRNONAA, GFRAA  No results found for: WBC, RBC, HGB, HCT, PLT, MCV, MCH, MCHC, RDW, LYMPHSABS, MONOABS, EOSABS, BASOSABS  No results found for: POCLITH, LITHIUM   No results found for: PHENYTOIN, PHENOBARB, VALPROATE, CBMZ   .res Assessment: Plan:    Traevon was seen today for attention deficit hyperactivity disorder (adhd), combined t, follow-up and sleeping problem.  Diagnoses and all orders for this visit:  Attention deficit hyperactivity disorder (ADHD), combined type -     amphetamine-dextroamphetamine (ADDERALL) 15 MG tablet; Take 1 tablet by mouth 3 (three) times daily. -     Amphet-Dextroamphet 3-Bead ER (MYDAYIS) 37.5 MG CP24; Take 1 capsule by mouth in the morning.  Essential hypertension -     losartan (COZAAR) 50 MG tablet; Take 1 tablet (50 mg total) by mouth daily.  Insomnia due to mental condition   Options gabapentin, Intermezzo, trazodone, mirtazapine, sonata, amitriptyline, Seroquel and disc options and SE each  Options for treating nocturia.  Options for sleep.  At great length  discussed the need for CPAP and the complications of untreated sleep apnea.  There is a delay in his ability to get treatment.  We discussed some ways to try to shortcut this and did some problem solving.  Discussed the option of using it used machine on AutoSet..  Discussed potential benefits, risks, and side effects of stimulants with patient to include increased heart rate, palpitations, insomnia, increased anxiety, increased irritability, or decreased appetite.  Instructed patient to contact office if experiencing any significant tolerability issues.  Try Mydayis 50 bc inadequate duration from Vyvanse. Otherwise using Adderall in Am and Vyvanse 60 at noon.  3 mos  Lynder Parents, MD, DFAPA   Please see After Visit Summary for patient specific instructions.  No future appointments.  No orders of the defined types were placed in this encounter.     -------------------------------

## 2020-11-17 ENCOUNTER — Other Ambulatory Visit: Payer: Self-pay | Admitting: Psychiatry

## 2020-11-17 DIAGNOSIS — I10 Essential (primary) hypertension: Secondary | ICD-10-CM

## 2020-12-07 ENCOUNTER — Other Ambulatory Visit: Payer: Self-pay | Admitting: Psychiatry

## 2020-12-07 ENCOUNTER — Telehealth: Payer: Self-pay | Admitting: Psychiatry

## 2020-12-07 DIAGNOSIS — F902 Attention-deficit hyperactivity disorder, combined type: Secondary | ICD-10-CM

## 2020-12-07 MED ORDER — MYDAYIS 50 MG PO CP24: 1.0000 | ORAL_CAPSULE | Freq: Every morning | ORAL | 0 refills | Status: DC

## 2020-12-07 MED ORDER — MYDAYIS 50 MG PO CP24: 1.0000 | ORAL_CAPSULE | Freq: Every day | ORAL | 0 refills | Status: DC

## 2020-12-07 NOTE — Telephone Encounter (Signed)
Please review

## 2020-12-07 NOTE — Telephone Encounter (Signed)
Pt would like a refill on Mydayis 50 mg for 2 months. [facility] [address]

## 2020-12-15 ENCOUNTER — Other Ambulatory Visit: Payer: Self-pay | Admitting: Psychiatry

## 2020-12-15 ENCOUNTER — Telehealth: Payer: Self-pay | Admitting: Psychiatry

## 2020-12-15 MED ORDER — MYDAYIS 37.5 MG PO CP24: 1.0000 | ORAL_CAPSULE | Freq: Every morning | ORAL | 0 refills | Status: DC

## 2020-12-15 NOTE — Telephone Encounter (Signed)
Pt left a message stating that the mydayis 50 mg is too strong and needs it to be 37.5 mg instead. He said the 50 is too strong. Please cancel the two scripts and [facility] the [facility] [address]

## 2020-12-15 NOTE — Telephone Encounter (Signed)
Call pharmacy to cancel 50 mg RX Mydayis

## 2020-12-15 NOTE — Progress Notes (Signed)
Mydayis 50 mg is too strong.  Dosage reduced to 37.5 mg daily.

## 2020-12-15 NOTE — Telephone Encounter (Signed)
Rx cancelled.

## 2020-12-15 NOTE — Telephone Encounter (Signed)
Please review.Is it ok for me to cancel Rx ?

## 2020-12-18 ENCOUNTER — Other Ambulatory Visit: Payer: Self-pay | Admitting: Psychiatry

## 2020-12-18 DIAGNOSIS — I10 Essential (primary) hypertension: Secondary | ICD-10-CM

## 2021-01-30 ENCOUNTER — Other Ambulatory Visit: Payer: Self-pay | Admitting: Psychiatry

## 2021-01-30 DIAGNOSIS — I10 Essential (primary) hypertension: Secondary | ICD-10-CM

## 2021-01-30 MED ORDER — MYDAYIS 37.5 MG PO CP24: 1.0000 | ORAL_CAPSULE | Freq: Every morning | ORAL | 0 refills | Status: DC

## 2021-01-30 NOTE — Telephone Encounter (Signed)
Pt called also need Rx for MYDAYIS  37.5 MG to [facility]. Pt out.

## 2021-04-06 ENCOUNTER — Telehealth: Payer: Self-pay | Admitting: Psychiatry

## 2021-04-06 ENCOUNTER — Other Ambulatory Visit: Payer: Self-pay

## 2021-04-06 MED ORDER — MYDAYIS 37.5 MG PO CP24: 1.0000 | ORAL_CAPSULE | Freq: Every morning | ORAL | 0 refills | Status: DC

## 2021-04-06 NOTE — Telephone Encounter (Signed)
Pended.

## 2021-04-06 NOTE — Telephone Encounter (Signed)
Apt 8/9

## 2021-04-24 ENCOUNTER — Telehealth (INDEPENDENT_AMBULATORY_CARE_PROVIDER_SITE_OTHER): Payer: 59 | Admitting: Psychiatry

## 2021-04-24 ENCOUNTER — Encounter: Payer: Self-pay | Admitting: Psychiatry

## 2021-04-24 DIAGNOSIS — F902 Attention-deficit hyperactivity disorder, combined type: Secondary | ICD-10-CM | POA: Diagnosis not present

## 2021-04-24 DIAGNOSIS — F5105 Insomnia due to other mental disorder: Secondary | ICD-10-CM | POA: Diagnosis not present

## 2021-04-24 MED ORDER — MYDAYIS 37.5 MG PO CP24: 37.5000 mg | ORAL_CAPSULE | ORAL | 0 refills | Status: DC

## 2021-04-24 MED ORDER — MYDAYIS 37.5 MG PO CP24: 37.5000 mg | ORAL_CAPSULE | Freq: Every morning | ORAL | 0 refills | Status: DC

## 2021-04-24 MED ORDER — MYDAYIS 37.5 MG PO CP24: 1.0000 | ORAL_CAPSULE | Freq: Every morning | ORAL | 0 refills | Status: DC

## 2021-04-24 MED ORDER — AMPHETAMINE-DEXTROAMPHETAMINE 15 MG PO TABS: 15.0000 mg | ORAL_TABLET | Freq: Three times a day (TID) | ORAL | 0 refills | Status: DC

## 2021-04-24 NOTE — Progress Notes (Signed)
Maurice Gomez [id number] 65 y.o.  Video Visit via My Chart  I connected with pt by My Chart and verified that I am speaking with the correct person using two identifiers.   I discussed the limitations, risks, security and privacy concerns of performing an evaluation and management service by My Chart  and the availability of in person appointments. I also discussed with the patient that there may be a patient responsible charge related to this service. The patient expressed understanding and agreed to proceed.  I discussed the assessment and treatment plan with the patient. The patient was provided an opportunity to ask questions and all were answered. The patient agreed with the plan and demonstrated an understanding of the instructions.   The patient was advised to call back or seek an in-person evaluation if the symptoms worsen or if the condition fails to improve as anticipated.  I provided 30 minutes of video time during this encounter.  The patient was located at home and the provider was located office. Session from 500 to 530  Subjective:   Patient ID:  Maurice Gomez is a 65 y.o. (DOB 11-07-1955) male.  Chief Complaint:  Chief Complaint  Patient presents with   Follow-up   ADHD    HPI Maurice Gomez presents to the office today for follow-up of ADD.    Increased Vyvanse to 60 mg 2019.  seen March 04, 2019.  No meds were changed.  He continued Vyvanse 60 mg daily.  Oral days when he forgot Vyvanse he would take Adderall. Also variety of tx for sleep have been used.  02/2020 appt noted: Working long hours.  Busy but OK.  Maurice Gomez still sleeps until noon and has had some heart issues.  She's had MI.   Sleep is better but not great.   4 to 4& 1/2 hours sleep and not tired. Vyvanse only lasts about 6 hours now.  Problems with sleep.  No problems going. Awaken after 4 hours and difficult to go back to sleep.  Gettting worse and going on for a year.   Misses the lack of sleep. Temazepam inconsistently keeps him asleep and doesn't want to take it all the time DT feeling drugged.  Doesn't believe related to stimulant bc independent of vyvanse use.  Never comfortable when he sleeps. Some pain issues, laying.  Tried new mattress.  Tried sonata at onset but awoke.  Also has to get up to urinate. Plan: Try Mydayis 50 bc inadequate duration from Vyvanse. Otherwise using Adderall in Am and Vyvanse 60 at noon.  10/23/2020 appointment with the following noted: Mydayis never tried.   Been a little worried about weight and BP.  BP got elevated with weight gain. BP usually 140-150/85-95. Dx OSA AHI 36 and can't get CPAP for 3 mos DT chip shortage.  Loud snoring and nonrestorative sleep and fatigue ADD would like longer duration. Patient reports stable mood and denies depressed or irritable moods.  Patient denies any recent difficulty with anxiety. Denies appetite disturbance.  Patient reports that energy and motivation have been good.    Patient denies any suicidal ideation. Plan: Try Mydayis 50 bc inadequate duration from Vyvanse. Otherwise using Adderall in Am and Vyvanse 60 at noon.  04/24/2021 appointment with the following noted: SP Covid this summer.  Still testing positive.  Still some loss of taste and smell and slight cough.   Maurice Gomez and Maurice Gomez never tested positive or got sick. Also had period of back problem. Likes  Mydayis with better duration but still not 16 hours. 50 mg was too much. Still needs Adderall bc if forgets Mydayis can take it later. Vyvanse super effective but crashes midday.  Mydayis less effective without crash and works. Change BP meds and it's managed.   Prior sleep meds tried include: Ambien which worked but nervous over the withdrawal effects he had in the past.  Restoril 30, Xanax, Diazepam, trazodone hangover,  Sonata brief  Other previous psychiatric medications include include Ritalin, Concerta 36, Ritalin SR, Daytrana  with skin irritation, Vyvanse 60, Adderall, Mydayis never tried, Strattera  Review of Systems:  Review of Systems  Constitutional:  Positive for fatigue.  Respiratory:  Positive for cough.   Genitourinary:  Negative for difficulty urinating.  Musculoskeletal:  Positive for arthralgias and back pain. Negative for neck pain.  Psychiatric/Behavioral:  Positive for sleep disturbance. Negative for agitation, behavioral problems, confusion, decreased concentration, dysphoric mood, hallucinations, self-injury and suicidal ideas. The patient is not nervous/anxious and is not hyperactive.    Medications: Prior to Admission: (Not in a hospital admission)   Current Outpatient Medications  Medication Sig Dispense Refill   [START ON 05/22/2021] Amphet-Dextroamphet 3-Bead ER (MYDAYIS) 37.5 MG CP24 Take 37.5 mg by mouth every morning. 30 capsule 0   [START ON 06/19/2021] Amphet-Dextroamphet 3-Bead ER (MYDAYIS) 37.5 MG CP24 Take 37.5 mg by mouth every morning. 30 capsule 0   levothyroxine (SYNTHROID) 112 MCG tablet Take 112 mcg by mouth every morning.     losartan-hydrochlorothiazide (HYZAAR) 50-12.5 MG tablet Take 1 tablet by mouth daily.     pantoprazole (PROTONIX) 20 MG tablet Take 20 mg by mouth daily.     Tamsulosin HCl (FLOMAX) 0.4 MG CAPS Take by mouth at bedtime.     Amphet-Dextroamphet 3-Bead ER (MYDAYIS) 37.5 MG CP24 Take 1 tablet by mouth every morning. 30 capsule 0   amphetamine-dextroamphetamine (ADDERALL) 15 MG tablet Take 1 tablet by mouth 3 (three) times daily. 90 tablet 0   rosuvastatin (CRESTOR) 5 MG tablet Take 5 mg by mouth daily. (Patient not taking: Reported on 04/24/2021)     No current facility-administered medications for this visit.    Medication Side Effects: None  Allergies:  Allergies  Allergen Reactions   Morphine And Related Itching    Past Medical History:  Diagnosis Date   Cystic kidney disease 2009   right (pt denies)   Heartburn    Hypercholesterolemia     Microscopic hematuria 2009   Polyp of colon    Prostate cancer ([place]) 07/27/12   gleason 3+3=6, vol37 ml   Pulmonary embolism ([facility]CC)    hx   Thyroid disease    hypothyroid    Family History  Problem Relation Age of Onset   Cancer Brother        prostate, completed radiaiton tx 1 year ago   Colon cancer Neg Hx     Social History   Socioeconomic History   Marital status: Married    Spouse name: Not on file   Number of children: Not on file   Years of education: Not on file   Highest education level: Not on file  Occupational History   Not on file  Tobacco Use   Smoking status: Former    Types: Cigarettes    Quit date: 08/18/1987    Years since quitting: 33.7   Smokeless tobacco: Never  Substance and Sexual Activity   Alcohol use: No    Alcohol/week: 0.0 standard drinks   Drug use: No  Sexual activity: Not on file  Other Topics Concern   Not on file  Social History Narrative   Not on file   Social Determinants of Health   Financial Resource Strain: Not on file  Food Insecurity: Not on file  Transportation Needs: Not on file  Physical Activity: Not on file  Stress: Not on file  Social Connections: Not on file  Intimate Partner Violence: Not on file    Past Medical History, Surgical history, Social history, and Family history were reviewed and updated as appropriate.   Please see review of systems for further details on the patient's review from today.   Objective:   Physical Exam:  There were no vitals taken for this visit.  Physical Exam Constitutional:      General: He is not in acute distress.    Appearance: He is obese.  Musculoskeletal:        General: No deformity.  Neurological:     Mental Status: He is alert and oriented to person, place, and time.     Coordination: Coordination normal.     Gait: Gait normal.  Psychiatric:        Attention and Perception: Attention normal.        Mood and Affect: Mood is not anxious or depressed. Affect is  not labile, blunt, angry or inappropriate.        Speech: Speech normal.        Behavior: Behavior normal.        Thought Content: Thought content normal. Thought content does not include homicidal or suicidal ideation. Thought content does not include homicidal or suicidal plan.        Cognition and Memory: Cognition normal.        Judgment: Judgment normal.     Comments: Insight intact. No auditory or visual hallucinations. No delusions.     Lab Review:  No results found for: NA, K, CL, CO2, GLUCOSE, BUN, CREATININE, CALCIUM, PROT, ALBUMIN, AST, ALT, ALKPHOS, BILITOT, GFRNONAA, GFRAA  No results found for: WBC, RBC, HGB, HCT, PLT, MCV, MCH, MCHC, RDW, LYMPHSABS, MONOABS, EOSABS, BASOSABS  No results found for: POCLITH, LITHIUM   No results found for: PHENYTOIN, PHENOBARB, VALPROATE, CBMZ   .res Assessment: Plan:    Maurice Gomez was seen today for follow-up and adhd.  Diagnoses and all orders for this visit:  Attention deficit hyperactivity disorder (ADHD), combined type -     Amphet-Dextroamphet 3-Bead ER (MYDAYIS) 37.5 MG CP24; Take 1 tablet by mouth every morning. -     amphetamine-dextroamphetamine (ADDERALL) 15 MG tablet; Take 1 tablet by mouth 3 (three) times daily. -     Amphet-Dextroamphet 3-Bead ER (MYDAYIS) 37.5 MG CP24; Take 37.5 mg by mouth every morning. -     Amphet-Dextroamphet 3-Bead ER (MYDAYIS) 37.5 MG CP24; Take 37.5 mg by mouth every morning.  Insomnia due to mental condition  Options gabapentin, Intermezzo, trazodone, mirtazapine, sonata, amitriptyline, Seroquel and disc options and SE each  Options for sleep.  At great length discussed the need for CPAP and the complications of untreated sleep apnea.  There is a delay in his ability to get treatment.  We discussed some ways to try to shortcut this and did some problem solving.  Discussed the option of using it used machine on AutoSet..  Discussed potential benefits, risks, and side effects of stimulants with  patient to include increased heart rate, palpitations, insomnia, increased anxiety, increased irritability, or decreased appetite.  Instructed patient to contact office if experiencing any  significant tolerability issues.  Disc post Covid cognitive risks.  Disc Ozempic but PCP refused.  He'll retry with PCP later. Disc Mounjaro.   Mydayis 37.5 mg daily bc inadequate duration from Vyvanse. Otherwise using Adderall in Am   4 mos  Lynder Parents, MD, DFAPA   Please see After Visit Summary for patient specific instructions.  No future appointments.   No orders of the defined types were placed in this encounter.     -------------------------------

## 2021-09-03 ENCOUNTER — Other Ambulatory Visit: Payer: Self-pay | Admitting: Psychiatry

## 2021-09-03 DIAGNOSIS — F902 Attention-deficit hyperactivity disorder, combined type: Secondary | ICD-10-CM

## 2021-09-03 MED ORDER — AMPHETAMINE-DEXTROAMPHETAMINE 15 MG PO TABS: 15.0000 mg | ORAL_TABLET | Freq: Three times a day (TID) | ORAL | 0 refills | Status: DC

## 2021-09-03 NOTE — Telephone Encounter (Signed)
Patient called in for refill on Adderall 15mg . Ph: [phone]. Appt 2/8. [facility]acy [facility] [place], [place]

## 2021-09-21 ENCOUNTER — Other Ambulatory Visit: Payer: Self-pay

## 2021-09-21 ENCOUNTER — Telehealth: Payer: Self-pay | Admitting: Psychiatry

## 2021-09-21 DIAGNOSIS — F902 Attention-deficit hyperactivity disorder, combined type: Secondary | ICD-10-CM

## 2021-09-21 MED ORDER — MYDAYIS 37.5 MG PO CP24: 37.5000 mg | ORAL_CAPSULE | ORAL | 0 refills | Status: DC

## 2021-09-21 NOTE — Telephone Encounter (Signed)
Patient lvm stating that he called his pharmacy and was told that doesn't have an active prescription for Mydais 37.5mg . He is down to his last pill and is going out of town on Monday. Ph: [phone]. [facility] [id number] [place]

## 2021-09-21 NOTE — Telephone Encounter (Signed)
Pended.

## 2021-10-24 ENCOUNTER — Ambulatory Visit (INDEPENDENT_AMBULATORY_CARE_PROVIDER_SITE_OTHER): Payer: 59 | Admitting: Psychiatry

## 2021-10-24 ENCOUNTER — Encounter: Payer: Self-pay | Admitting: Psychiatry

## 2021-10-24 ENCOUNTER — Other Ambulatory Visit: Payer: Self-pay

## 2021-10-24 DIAGNOSIS — F5105 Insomnia due to other mental disorder: Secondary | ICD-10-CM | POA: Diagnosis not present

## 2021-10-24 DIAGNOSIS — I10 Essential (primary) hypertension: Secondary | ICD-10-CM | POA: Diagnosis not present

## 2021-10-24 DIAGNOSIS — F902 Attention-deficit hyperactivity disorder, combined type: Secondary | ICD-10-CM | POA: Diagnosis not present

## 2021-10-24 MED ORDER — AMPHETAMINE-DEXTROAMPHETAMINE 15 MG PO TABS: 15.0000 mg | ORAL_TABLET | Freq: Three times a day (TID) | ORAL | 0 refills | Status: DC

## 2021-10-24 MED ORDER — MYDAYIS 37.5 MG PO CP24: 37.5000 mg | ORAL_CAPSULE | ORAL | 0 refills | Status: DC

## 2021-10-24 MED ORDER — MYDAYIS 37.5 MG PO CP24: 37.5000 mg | ORAL_CAPSULE | Freq: Every morning | ORAL | 0 refills | Status: DC

## 2021-10-24 MED ORDER — ZALEPLON 10 MG PO CAPS: 10.0000 mg | ORAL_CAPSULE | Freq: Every evening | ORAL | 0 refills | Status: DC | PRN

## 2021-10-24 MED ORDER — MYDAYIS 37.5 MG PO CP24: 1.0000 | ORAL_CAPSULE | Freq: Every morning | ORAL | 0 refills | Status: DC

## 2021-10-24 NOTE — Progress Notes (Signed)
Carrson Lightcap Lofstrom [id number] [birth year] 66 y.o.    Subjective:   Patient ID:  Maurice Gomez is a 66 y.o. (DOB 1955/12/26) male.  Chief Complaint:  Chief Complaint  Patient presents with   Follow-up   ADHD    HPI Maurice Gomez presents to the office today for follow-up of ADD.    Increased Vyvanse to 60 mg 2019.  seen March 04, 2019.  No meds were changed.  He continued Vyvanse 60 mg daily.  Oral days when he forgot Vyvanse he would take Adderall. Also variety of tx for sleep have been used.  02/2020 appt noted: Working long hours.  Busy but OK.  Maurice Gomez still sleeps until noon and has had some heart issues.  She's had MI.   Sleep is better but not great.   4 to 4& 1/2 hours sleep and not tired. Vyvanse only lasts about 6 hours now.  Problems with sleep.  No problems going. Awaken after 4 hours and difficult to go back to sleep.  Gettting worse and going on for a year.  Misses the lack of sleep. Temazepam inconsistently keeps him asleep and doesn't want to take it all the time DT feeling drugged.  Doesn't believe related to stimulant bc independent of vyvanse use.  Never comfortable when he sleeps. Some pain issues, laying.  Tried new mattress.  Tried sonata at onset but awoke.  Also has to get up to urinate. Plan: Try Mydayis 50 bc inadequate duration from Vyvanse. Otherwise using Adderall in Am and Vyvanse 60 at noon.  10/23/2020 appointment with the following noted: Mydayis never tried.   Been a little worried about weight and BP.  BP got elevated with weight gain. BP usually 140-150/85-95. Dx OSA AHI 36 and can't get CPAP for 3 mos DT chip shortage.  Loud snoring and nonrestorative sleep and fatigue ADD would like longer duration. Patient reports stable mood and denies depressed or irritable moods.  Patient denies any recent difficulty with anxiety. Denies appetite disturbance.  Patient reports that energy and motivation have been good.    Patient denies any  suicidal ideation. Plan: Try Mydayis 50 bc inadequate duration from Vyvanse. Otherwise using Adderall in Am and Vyvanse 60 at noon.  04/24/2021 appointment with the following noted: SP Covid this summer.  Still testing positive.  Still some loss of taste and smell and slight cough.   Maurice Gomez and Maurice Gomez never tested positive or got sick. Also had period of back problem. Likes Mydayis with better duration but still not 16 hours. 50 mg was too much. Still needs Adderall bc if forgets Mydayis can take it later. Vyvanse super effective but crashes midday.  Mydayis less effective without crash and works. Change BP meds and it's managed. Plan: Mydayis 37.5 mg daily bc inadequate duration from Vyvanse.  10/24/2021 appointment with the following noted: Likes Mydayis duration better but not.  Felt the amphetamine a bit more with Vyvanse.   Sleep is not as good as should be.  Struggle with CPAP.  Using nasal pillows.  Was off for 2 mos.  Restarted 3 weeks ago.  It leaks.   Sleeps probably 4 hours and sometimes less.  Not comfortable in bed.  Restored initially when wakes and then gets tired. Patient reports stable mood and denies depressed or irritable moods.  Patient denies any recent difficulty with anxiety.  Denies appetite disturbance.  Patient reports that energy and motivation have been good.   Patient denies any suicidal ideation.  Prior  sleep meds tried include:  Ambien which worked but nervous over the withdrawal effects he had in the past.   Restoril 30, Xanax, Diazepam, trazodone hangover,  Sonata brief Mirtazapine 7.5 severe hangover  Other previous psychiatric medications include include Ritalin, Concerta 36, Ritalin SR, Daytrana with skin irritation, Vyvanse 60, Adderall, Mydayis never tried, Strattera  Review of Systems:  Review of Systems  Constitutional:  Positive for fatigue.  Genitourinary:  Negative for difficulty urinating.  Musculoskeletal:  Positive for arthralgias and back pain.  Negative for neck pain.  Psychiatric/Behavioral:  Positive for sleep disturbance. Negative for agitation, behavioral problems, confusion, decreased concentration, dysphoric mood, hallucinations, self-injury and suicidal ideas. The patient is not nervous/anxious and is not hyperactive.    Medications: Prior to Admission: (Not in a hospital admission)   Current Outpatient Medications  Medication Sig Dispense Refill   levothyroxine (SYNTHROID) 112 MCG tablet Take 112 mcg by mouth every morning.     losartan-hydrochlorothiazide (HYZAAR) 50-12.5 MG tablet Take 1 tablet by mouth daily.     pantoprazole (PROTONIX) 20 MG tablet Take 20 mg by mouth daily.     rosuvastatin (CRESTOR) 5 MG tablet Take 5 mg by mouth daily.     Tamsulosin HCl (FLOMAX) 0.4 MG CAPS Take by mouth at bedtime.     zaleplon (SONATA) 10 MG capsule Take 1 capsule (10 mg total) by mouth at bedtime as needed for sleep. 30 capsule 0   [START ON 12/19/2021] Amphet-Dextroamphet 3-Bead ER (MYDAYIS) 37.5 MG CP24 Take 37.5 mg by mouth every morning. 30 capsule 0   [START ON 11/21/2021] Amphet-Dextroamphet 3-Bead ER (MYDAYIS) 37.5 MG CP24 Take 1 tablet by mouth every morning. 30 capsule 0   Amphet-Dextroamphet 3-Bead ER (MYDAYIS) 37.5 MG CP24 Take 37.5 mg by mouth every morning. 30 capsule 0   [START ON 11/21/2021] amphetamine-dextroamphetamine (ADDERALL) 15 MG tablet Take 1 tablet by mouth 3 (three) times daily. 90 tablet 0   amphetamine-dextroamphetamine (ADDERALL) 15 MG tablet Take 1 tablet by mouth 3 (three) times daily. 90 tablet 0   No current facility-administered medications for this visit.    Medication Side Effects: None  Allergies:  Allergies  Allergen Reactions   Morphine And Related Itching    Past Medical History:  Diagnosis Date   Cystic kidney disease 2009   right (pt denies)   Heartburn    Hypercholesterolemia    Microscopic hematuria 2009   Polyp of colon    Prostate cancer ([place]) 07/27/12   gleason 3+3=6, vol37  ml   Pulmonary embolism (HCC)    hx   Thyroid disease    hypothyroid    Family History  Problem Relation Age of Onset   Cancer Brother        prostate, completed radiaiton tx 1 year ago   Colon cancer Neg Hx     Social History   Socioeconomic History   Marital status: Married    Spouse name: Not on file   Number of children: Not on file   Years of education: Not on file   Highest education level: Not on file  Occupational History   Not on file  Tobacco Use   Smoking status: Former    Types: Cigarettes    Quit date: 08/18/1987    Years since quitting: 34.2   Smokeless tobacco: Never  Substance and Sexual Activity   Alcohol use: No    Alcohol/week: 0.0 standard drinks   Drug use: No   Sexual activity: Not on file  Other Topics  Concern   Not on file  Social History Narrative   Not on file   Social Determinants of Health   Financial Resource Strain: Not on file  Food Insecurity: Not on file  Transportation Needs: Not on file  Physical Activity: Not on file  Stress: Not on file  Social Connections: Not on file  Intimate Partner Violence: Not on file    Past Medical History, Surgical history, Social history, and Family history were reviewed and updated as appropriate.   Please see review of systems for further details on the patient's review from today.   Objective:   Physical Exam:  There were no vitals taken for this visit.  Physical Exam Constitutional:      General: He is not in acute distress.    Appearance: He is obese.  Musculoskeletal:        General: No deformity.  Neurological:     Mental Status: He is alert and oriented to person, place, and time.     Coordination: Coordination normal.     Gait: Gait normal.  Psychiatric:        Attention and Perception: Attention normal.        Mood and Affect: Mood is not anxious or depressed. Affect is not labile, blunt, angry or inappropriate.        Speech: Speech normal.        Behavior: Behavior  normal.        Thought Content: Thought content normal. Thought content is not delusional. Thought content does not include homicidal or suicidal ideation. Thought content does not include suicidal plan.        Cognition and Memory: Cognition normal.        Judgment: Judgment normal.     Comments: Insight intact. No auditory or visual hallucinations. No delusions.     Lab Review:  No results found for: NA, K, CL, CO2, GLUCOSE, BUN, CREATININE, CALCIUM, PROT, ALBUMIN, AST, ALT, ALKPHOS, BILITOT, GFRNONAA, GFRAA  No results found for: WBC, RBC, HGB, HCT, PLT, MCV, MCH, MCHC, RDW, LYMPHSABS, MONOABS, EOSABS, BASOSABS  No results found for: POCLITH, LITHIUM   No results found for: PHENYTOIN, PHENOBARB, VALPROATE, CBMZ   .res Assessment: Plan:    Maurice Gomez was seen today for follow-up and adhd.  Diagnoses and all orders for this visit:  Attention deficit hyperactivity disorder (ADHD), combined type -     Amphet-Dextroamphet 3-Bead ER (MYDAYIS) 37.5 MG CP24; Take 37.5 mg by mouth every morning. -     Amphet-Dextroamphet 3-Bead ER (MYDAYIS) 37.5 MG CP24; Take 1 tablet by mouth every morning. -     amphetamine-dextroamphetamine (ADDERALL) 15 MG tablet; Take 1 tablet by mouth 3 (three) times daily. -     amphetamine-dextroamphetamine (ADDERALL) 15 MG tablet; Take 1 tablet by mouth 3 (three) times daily. -     Amphet-Dextroamphet 3-Bead ER (MYDAYIS) 37.5 MG CP24; Take 37.5 mg by mouth every morning.  Insomnia due to mental condition  Essential hypertension  Other orders -     zaleplon (SONATA) 10 MG capsule; Take 1 capsule (10 mg total) by mouth at bedtime as needed for sleep.  Reviewed stimulant types and options.  Options gabapentin, Intermezzo, trazodone, mirtazapine, sonata, amitriptyline, Seroquel and disc options and SE each  Options for sleep.  At great length discussed the need for CPAP and the complications of untreated sleep apnea.   Discussed the option of using it used  machine on AutoSet.. Problem solved use. Needs more sleep.   Retry [employer]  5-10  Discussed potential benefits, risks, and side effects of stimulants with patient to include increased heart rate, palpitations, insomnia, increased anxiety, increased irritability, or decreased appetite.  Instructed patient to contact office if experiencing any significant tolerability issues.  Disc Ozempic but PCP refused.  He'll retry with PCP later. Disc Mounjaro.   Mydayis 37.5 mg daily bc inadequate duration from Vyvanse. Otherwise using Adderall in Am   4 mos  Lynder Parents, MD, DFAPA   Please see After Visit Summary for patient specific instructions.  Future Appointments  Date Time Provider [place]  02/19/2022  4:30 PM Cottle, Billey Co., MD [facility] None     No orders of the defined types were placed in this encounter.      -------------------------------

## 2021-11-10 ENCOUNTER — Encounter: Payer: Self-pay | Admitting: Psychiatry

## 2021-11-29 ENCOUNTER — Telehealth: Payer: Self-pay | Admitting: Psychiatry

## 2021-11-29 ENCOUNTER — Other Ambulatory Visit: Payer: Self-pay

## 2021-11-29 MED ORDER — ZALEPLON 10 MG PO CAPS: 10.0000 mg | ORAL_CAPSULE | Freq: Every evening | ORAL | 3 refills | Status: DC | PRN

## 2021-11-29 NOTE — Telephone Encounter (Signed)
Pended.

## 2021-11-29 NOTE — Telephone Encounter (Signed)
Pt LVM 3/15 after 5 pm requesting Rx for Zaleplon 10 mg [facility] [place]. [address] ?

## 2021-12-17 ENCOUNTER — Other Ambulatory Visit: Payer: Self-pay | Admitting: Urology

## 2021-12-17 DIAGNOSIS — C61 Malignant neoplasm of prostate: Secondary | ICD-10-CM

## 2022-01-11 ENCOUNTER — Ambulatory Visit
Admission: RE | Admit: 2022-01-11 | Discharge: 2022-01-11 | Disposition: A | Discharge status: Home / Self Care | Payer: 59 | Source: Ambulatory Visit | Attending: Urology | Admitting: Urology

## 2022-01-11 DIAGNOSIS — C61 Malignant neoplasm of prostate: Secondary | ICD-10-CM

## 2022-01-11 MED ORDER — GADOBENATE DIMEGLUMINE 529 MG/ML IV SOLN
20.0000 mL | Freq: Once | INTRAVENOUS | Status: AC | PRN
  Administered 2022-01-11: 20 mL via INTRAVENOUS

## 2022-02-07 ENCOUNTER — Other Ambulatory Visit: Payer: Self-pay | Admitting: Psychiatry

## 2022-02-07 ENCOUNTER — Telehealth: Payer: Self-pay | Admitting: Psychiatry

## 2022-02-07 DIAGNOSIS — F902 Attention-deficit hyperactivity disorder, combined type: Secondary | ICD-10-CM

## 2022-02-07 MED ORDER — AMPHETAMINE-DEXTROAMPHETAMINE 15 MG PO TABS: 15.0000 mg | ORAL_TABLET | Freq: Three times a day (TID) | ORAL | 0 refills | Status: DC

## 2022-02-07 MED ORDER — MYDAYIS 37.5 MG PO CP24: 1.0000 | ORAL_CAPSULE | Freq: Every morning | ORAL | 0 refills | Status: DC

## 2022-02-07 NOTE — Telephone Encounter (Signed)
Pt lvm that he needs refills on his mydayis 37.5 mg and his adderall 15 mg. [facility] [address]

## 2022-02-19 ENCOUNTER — Encounter: Payer: Self-pay | Admitting: Psychiatry

## 2022-02-19 ENCOUNTER — Ambulatory Visit (INDEPENDENT_AMBULATORY_CARE_PROVIDER_SITE_OTHER): Payer: 59 | Admitting: Psychiatry

## 2022-02-19 ENCOUNTER — Other Ambulatory Visit: Payer: Self-pay | Admitting: Psychiatry

## 2022-02-19 DIAGNOSIS — F5105 Insomnia due to other mental disorder: Secondary | ICD-10-CM | POA: Diagnosis not present

## 2022-02-19 DIAGNOSIS — F902 Attention-deficit hyperactivity disorder, combined type: Secondary | ICD-10-CM

## 2022-02-19 MED ORDER — MYDAYIS 50 MG PO CP24: 1.0000 | ORAL_CAPSULE | Freq: Every morning | ORAL | 0 refills | Status: DC

## 2022-02-19 MED ORDER — TADALAFIL 10 MG PO TABS: 10.0000 mg | ORAL_TABLET | Freq: Every day | ORAL | 0 refills | Status: DC | PRN

## 2022-02-19 MED ORDER — ZALEPLON 10 MG PO CAPS
10.0000 mg | ORAL_CAPSULE | Freq: Every evening | ORAL | 3 refills | Status: DC | PRN
Start: 2022-02-19 — End: 2022-08-05

## 2022-02-19 NOTE — Progress Notes (Signed)
Maurice Gomez Mix [id number] [birth year] 66 y.o.    Subjective:   Patient ID:  Maurice Gomez is a 66 y.o. (DOB April 25, 1956) male.  Chief Complaint:  Chief Complaint  Patient presents with   Follow-up   ADHD    HPI Maurice Gomez presents to the office today for follow-up of ADD.    Increased Vyvanse to 60 mg 2019.  seen March 04, 2019.  No meds were changed.  He continued Vyvanse 60 mg daily.  Oral days when he forgot Vyvanse he would take Adderall. Also variety of tx for sleep have been used.  02/2020 appt noted: Working long hours.  Busy but OK.  [name] still sleeps until noon and has had some heart issues.  She's had MI.   Sleep is better but not great.   4 to 4& 1/2 hours sleep and not tired. Vyvanse only lasts about 6 hours now.  Problems with sleep.  No problems going. Awaken after 4 hours and difficult to go back to sleep.  Gettting worse and going on for a year.  Misses the lack of sleep. Temazepam inconsistently keeps him asleep and doesn't want to take it all the time DT feeling drugged.  Doesn't believe related to stimulant bc independent of vyvanse use.  Never comfortable when he sleeps. Some pain issues, laying.  Tried new mattress.  Tried sonata at onset but awoke.  Also has to get up to urinate. Plan: Try Mydayis 50 bc inadequate duration from Vyvanse. Otherwise using Adderall in Am and Vyvanse 60 at noon.  10/23/2020 appointment with the following noted: Mydayis never tried.   Been a little worried about weight and BP.  BP got elevated with weight gain. BP usually 140-150/85-95. Dx OSA AHI 36 and can't get CPAP for 3 mos DT chip shortage.  Loud snoring and nonrestorative sleep and fatigue ADD would like longer duration. Patient reports stable mood and denies depressed or irritable moods.  Patient denies any recent difficulty with anxiety. Denies appetite disturbance.  Patient reports that energy and motivation have been good.    Patient denies any  suicidal ideation. Plan: Try Mydayis 50 bc inadequate duration from Vyvanse. Otherwise using Adderall in Am and Vyvanse 60 at noon.  04/24/2021 appointment with the following noted: SP Covid this summer.  Still testing positive.  Still some loss of taste and smell and slight cough.   [name] and [name] never tested positive or got sick. Also had period of back problem. Likes Mydayis with better duration but still not 16 hours. 50 mg was too much. Still needs Adderall bc if forgets Mydayis can take it later. Vyvanse super effective but crashes midday.  Mydayis less effective without crash and works. Change BP meds and it's managed. Plan: Mydayis 37.5 mg daily bc inadequate duration from Vyvanse.  10/24/2021 appointment with the following noted: Likes Mydayis duration better but not.  Felt the amphetamine a bit more with Vyvanse.   Sleep is not as good as should be.  Struggle with CPAP.  Using nasal pillows.  Was off for 2 mos.  Restarted 3 weeks ago.  It leaks.   Sleeps probably 4 hours and sometimes less.  Not comfortable in bed.  Restored initially when wakes and then gets tired. Patient reports stable mood and denies depressed or irritable moods.  Patient denies any recent difficulty with anxiety.  Denies appetite disturbance.  Patient reports that energy and motivation have been good.   Patient denies any suicidal ideation.  02/19/22  appt noted: On Mydayis 37.5 mg daily or Adderall. No SE Focus drifting a little in a way it has not in years.  Took D's vyvanse 60 and it seemed better. Takes Adderall if forgets Mydayis. Not using CPAP.    Prior sleep meds tried include:  Ambien which worked but nervous over the withdrawal effects he had in the past.   Restoril 30, Xanax, Diazepam, trazodone hangover,  Sonata brief Mirtazapine 7.5 severe hangover  Other previous psychiatric medications include include Ritalin, Concerta 36, Ritalin SR, Daytrana with skin irritation, Vyvanse 60, Adderall, Mydayis  never tried, Strattera  Review of Systems:  Review of Systems  Constitutional:  Negative for fatigue.  Genitourinary:  Negative for difficulty urinating.  Musculoskeletal:  Positive for arthralgias and back pain. Negative for neck pain.  Psychiatric/Behavioral:  Negative for agitation, behavioral problems, confusion, decreased concentration, dysphoric mood, hallucinations, self-injury, sleep disturbance and suicidal ideas. The patient is not nervous/anxious and is not hyperactive.    Medications: Prior to Admission: (Not in a hospital admission)   Current Outpatient Medications  Medication Sig Dispense Refill   Amphet-Dextroamphet 3-Bead ER (MYDAYIS) 50 MG CP24 Take 1 capsule by mouth every morning. 30 capsule 0   [START ON 03/19/2022] Amphet-Dextroamphet 3-Bead ER (MYDAYIS) 50 MG CP24 Take 1 capsule by mouth every morning. 30 capsule 0   [START ON 04/16/2022] Amphet-Dextroamphet 3-Bead ER (MYDAYIS) 50 MG CP24 Take 1 capsule by mouth every morning. 30 capsule 0   amphetamine-dextroamphetamine (ADDERALL) 15 MG tablet Take 1 tablet by mouth 3 (three) times daily. 90 tablet 0   amphetamine-dextroamphetamine (ADDERALL) 15 MG tablet Take 1 tablet by mouth 3 (three) times daily. 90 tablet 0   levothyroxine (SYNTHROID) 112 MCG tablet Take 112 mcg by mouth every morning.     losartan-hydrochlorothiazide (HYZAAR) 50-12.5 MG tablet Take 1 tablet by mouth daily.     pantoprazole (PROTONIX) 20 MG tablet Take 20 mg by mouth daily.     rosuvastatin (CRESTOR) 5 MG tablet Take 5 mg by mouth daily.     Tamsulosin HCl (FLOMAX) 0.4 MG CAPS Take by mouth at bedtime.     zaleplon (SONATA) 10 MG capsule Take 1 capsule (10 mg total) by mouth at bedtime as needed for sleep. 30 capsule 3   No current facility-administered medications for this visit.    Medication Side Effects: None  Allergies:  Allergies  Allergen Reactions   Morphine And Related Itching    Past Medical History:  Diagnosis Date   Cystic  kidney disease 2009   right (pt denies)   Heartburn    Hypercholesterolemia    Microscopic hematuria 2009   Polyp of colon    Prostate cancer ([place]) 07/27/12   gleason 3+3=6, vol37 ml   Pulmonary embolism ([facility]CC)    hx   Thyroid disease    hypothyroid    Family History  Problem Relation Age of Onset   Cancer Brother        prostate, completed radiaiton tx 1 year ago   Colon cancer Neg Hx     Social History   Socioeconomic History   Marital status: Married    Spouse name: Not on file   Number of children: Not on file   Years of education: Not on file   Highest education level: Not on file  Occupational History   Not on file  Tobacco Use   Smoking status: Former    Types: Cigarettes    Quit date: 08/18/1987    Years since  quitting: 34.5   Smokeless tobacco: Never  Substance and Sexual Activity   Alcohol use: No    Alcohol/week: 0.0 standard drinks   Drug use: No   Sexual activity: Not on file  Other Topics Concern   Not on file  Social History Narrative   Not on file   Social Determinants of Health   Financial Resource Strain: Not on file  Food Insecurity: Not on file  Transportation Needs: Not on file  Physical Activity: Not on file  Stress: Not on file  Social Connections: Not on file  Intimate Partner Violence: Not on file    Past Medical History, Surgical history, Social history, and Family history were reviewed and updated as appropriate.   Please see review of systems for further details on the patient's review from today.   Objective:   Physical Exam:  There were no vitals taken for this visit.  Physical Exam Constitutional:      General: He is not in acute distress.    Appearance: He is obese.  Musculoskeletal:        General: No deformity.  Neurological:     Mental Status: He is alert and oriented to person, place, and time.     Coordination: Coordination normal.     Gait: Gait normal.  Psychiatric:        Attention and Perception:  Attention normal.        Mood and Affect: Mood is not anxious or depressed. Affect is not labile, blunt, angry or inappropriate.        Speech: Speech normal.        Behavior: Behavior normal.        Thought Content: Thought content normal. Thought content is not delusional. Thought content does not include homicidal or suicidal ideation. Thought content does not include suicidal plan.        Cognition and Memory: Cognition normal.        Judgment: Judgment normal.     Comments: Insight intact. No auditory or visual hallucinations. No delusions.     Lab Review:  No results found for: NA, K, CL, CO2, GLUCOSE, BUN, CREATININE, CALCIUM, PROT, ALBUMIN, AST, ALT, ALKPHOS, BILITOT, GFRNONAA, GFRAA  No results found for: WBC, RBC, HGB, HCT, PLT, MCV, MCH, MCHC, RDW, LYMPHSABS, MONOABS, EOSABS, BASOSABS  No results found for: POCLITH, LITHIUM   No results found for: PHENYTOIN, PHENOBARB, VALPROATE, CBMZ   .res Assessment: Plan:    Luman was seen today for follow-up and adhd.  Diagnoses and all orders for this visit:  Attention deficit hyperactivity disorder (ADHD), combined type -     Amphet-Dextroamphet 3-Bead ER (MYDAYIS) 50 MG CP24; Take 1 capsule by mouth every morning. -     Amphet-Dextroamphet 3-Bead ER (MYDAYIS) 50 MG CP24; Take 1 capsule by mouth every morning. -     Amphet-Dextroamphet 3-Bead ER (MYDAYIS) 50 MG CP24; Take 1 capsule by mouth every morning.  Insomnia due to mental condition  Reviewed stimulant types and options.  Options gabapentin, Intermezzo, trazodone, mirtazapine, sonata, amitriptyline, Seroquel and disc options and SE each  Options for sleep.  At great length discussed the need for CPAP and the complications of untreated sleep apnea.   Discussed the option of using it used machine on AutoSet.. Problem solved use. Sleep is better [employer] 5-10  Discussed potential benefits, risks, and side effects of stimulants with patient to include increased  heart rate, palpitations, insomnia, increased anxiety, increased irritability, or decreased appetite.  Instructed patient to  contact office if experiencing any significant tolerability issues.  Disc Ozempic but PCP refused.  He'll retry with PCP later. Disc Mounjaro.   Increase Mydayis 50 mg daily bc inadequate duration from Vyvanse and need for better focus Otherwise using Adderall in Am   4 mos  Lynder Parents, MD, DFAPA   Please see After Visit Summary for patient specific instructions.  No future appointments.    No orders of the defined types were placed in this encounter.      -------------------------------

## 2022-05-24 ENCOUNTER — Other Ambulatory Visit: Payer: Self-pay | Admitting: Psychiatry

## 2022-06-05 ENCOUNTER — Telehealth: Payer: Self-pay | Admitting: Psychiatry

## 2022-06-05 NOTE — Telephone Encounter (Signed)
Patient lvm at 4:26 stating that prescription for Adderall '15mg'$  was not sent into pharmacy. [facility] [id number] Ph: [phone] Appt 11/21

## 2022-06-05 NOTE — Telephone Encounter (Signed)
Pt has not been prescribed this since may.Should he be taking this with mydayis?

## 2022-06-06 ENCOUNTER — Other Ambulatory Visit: Payer: Self-pay | Admitting: Psychiatry

## 2022-06-06 DIAGNOSIS — F902 Attention-deficit hyperactivity disorder, combined type: Secondary | ICD-10-CM

## 2022-06-06 MED ORDER — AMPHETAMINE-DEXTROAMPHETAMINE 15 MG PO TABS: 15.0000 mg | ORAL_TABLET | Freq: Three times a day (TID) | ORAL | 0 refills | Status: DC

## 2022-06-06 NOTE — Telephone Encounter (Signed)
Sent RX.  He uses this on days he doesn't take Mydayis

## 2022-06-24 ENCOUNTER — Ambulatory Visit: Payer: 59 | Admitting: Psychiatry

## 2022-07-01 ENCOUNTER — Other Ambulatory Visit: Payer: Self-pay

## 2022-07-01 ENCOUNTER — Telehealth: Payer: Self-pay | Admitting: Psychiatry

## 2022-07-01 DIAGNOSIS — F902 Attention-deficit hyperactivity disorder, combined type: Secondary | ICD-10-CM

## 2022-07-01 MED ORDER — MYDAYIS 50 MG PO CP24: 1.0000 | ORAL_CAPSULE | Freq: Every morning | ORAL | 0 refills | Status: DC

## 2022-07-01 NOTE — Telephone Encounter (Signed)
Pt LVM @ 1:11p.  He's out of meds.  He said he needs Mydayis sent to [place].  Next appt 11/21

## 2022-07-01 NOTE — Telephone Encounter (Signed)
Pended.

## 2022-08-02 ENCOUNTER — Telehealth: Payer: Self-pay | Admitting: Psychiatry

## 2022-08-02 ENCOUNTER — Other Ambulatory Visit: Payer: Self-pay

## 2022-08-02 NOTE — Telephone Encounter (Signed)
Next visit is 08/06/22. Requesting a refill on Sonata 10 mg called to:  [id number] [place] [place]  Phone: [phone] Fax: [phone]

## 2022-08-02 NOTE — Telephone Encounter (Signed)
Last filled

## 2022-08-02 NOTE — Telephone Encounter (Signed)
Last filled 10/23, due 11/20, has appt with Dr. Clovis Pu on 11/21

## 2022-08-05 ENCOUNTER — Other Ambulatory Visit: Payer: Self-pay

## 2022-08-05 MED ORDER — ZALEPLON 10 MG PO CAPS: 10.0000 mg | ORAL_CAPSULE | Freq: Every evening | ORAL | 3 refills | Status: DC | PRN

## 2022-08-05 NOTE — Telephone Encounter (Signed)
Pended.

## 2022-08-06 ENCOUNTER — Ambulatory Visit: Payer: 59 | Admitting: Psychiatry

## 2022-08-06 ENCOUNTER — Encounter: Payer: Self-pay | Admitting: Psychiatry

## 2022-08-06 VITALS — BP 133/80 | HR 70

## 2022-08-06 DIAGNOSIS — F902 Attention-deficit hyperactivity disorder, combined type: Secondary | ICD-10-CM | POA: Diagnosis not present

## 2022-08-06 DIAGNOSIS — I10 Essential (primary) hypertension: Secondary | ICD-10-CM

## 2022-08-06 DIAGNOSIS — F5105 Insomnia due to other mental disorder: Secondary | ICD-10-CM

## 2022-08-06 MED ORDER — AMPHET-DEXTROAMPHET 3-BEAD ER 50 MG PO CP24: 1.0000 | ORAL_CAPSULE | Freq: Every morning | ORAL | 0 refills | Status: DC

## 2022-08-06 MED ORDER — AMPHETAMINE-DEXTROAMPHETAMINE 15 MG PO TABS: 15.0000 mg | ORAL_TABLET | Freq: Three times a day (TID) | ORAL | 0 refills | Status: DC

## 2022-08-06 NOTE — Progress Notes (Signed)
Maurice Gomez [id number] 66 y.o.    Subjective:   Patient ID:  Maurice Gomez is a 66 y.o. (DOB April 25, 1956) male.  Chief Complaint:  Chief Complaint  Patient presents with   Follow-up   ADHD    HPI Maurice Gomez presents to the office today for follow-up of ADD.    Increased Vyvanse to 60 mg 2019.  seen March 04, 2019.  No meds were changed.  He continued Vyvanse 60 mg daily.  Oral days when he forgot Vyvanse he would take Adderall. Also variety of tx for sleep have been used.  02/2020 appt noted: Working long hours.  Busy but OK.  Maurice Gomez still sleeps until noon and has had some heart issues.  She's had MI.   Sleep is better but not great.   4 to 4& 1/2 hours sleep and not tired. Vyvanse only lasts about 6 hours now.  Problems with sleep.  No problems going. Awaken after 4 hours and difficult to go back to sleep.  Gettting worse and going on for a year.  Misses the lack of sleep. Temazepam inconsistently keeps him asleep and doesn't want to take it all the time DT feeling drugged.  Doesn't believe related to stimulant bc independent of vyvanse use.  Never comfortable when he sleeps. Some pain issues, laying.  Tried new mattress.  Tried sonata at onset but awoke.  Also has to get up to urinate. Plan: Try Mydayis 50 bc inadequate duration from Vyvanse. Otherwise using Adderall in Am and Vyvanse 60 at noon.  10/23/2020 appointment with the following noted: Mydayis never tried.   Been a little worried about weight and BP.  BP got elevated with weight gain. BP usually 140-150/85-95. Dx OSA AHI 36 and can't get CPAP for 3 mos DT chip shortage.  Loud snoring and nonrestorative sleep and fatigue ADD would like longer duration. Patient reports stable mood and denies depressed or irritable moods.  Patient denies any recent difficulty with anxiety. Denies appetite disturbance.  Patient reports that energy and motivation have been good.    Patient denies any  suicidal ideation. Plan: Try Mydayis 50 bc inadequate duration from Vyvanse. Otherwise using Adderall in Am and Vyvanse 60 at noon.  04/24/2021 appointment with the following noted: SP Covid this summer.  Still testing positive.  Still some loss of taste and smell and slight cough.   Maurice Gomez and Maurice Gomez never tested positive or got sick. Also had period of back problem. Likes Mydayis with better duration but still not 16 hours. 50 mg was too much. Still needs Adderall bc if forgets Mydayis can take it later. Vyvanse super effective but crashes midday.  Mydayis less effective without crash and works. Change BP meds and it's managed. Plan: Mydayis 37.5 mg daily bc inadequate duration from Vyvanse.  10/24/2021 appointment with the following noted: Likes Mydayis duration better but not.  Felt the amphetamine a bit more with Vyvanse.   Sleep is not as good as should be.  Struggle with CPAP.  Using nasal pillows.  Was off for 2 mos.  Restarted 3 weeks ago.  It leaks.   Sleeps probably 4 hours and sometimes less.  Not comfortable in bed.  Restored initially when wakes and then gets tired. Patient reports stable mood and denies depressed or irritable moods.  Patient denies any recent difficulty with anxiety.  Denies appetite disturbance.  Patient reports that energy and motivation have been good.   Patient denies any suicidal ideation.  02/19/22  appt noted: On Mydayis 37.5 mg daily or Adderall. No SE Focus drifting a little in a way it has not in years.  Took D's vyvanse 60 and it seemed better. Takes Adderall if forgets Mydayis. Not using CPAP.   Plan: Increase Mydayis 50 mg daily bc inadequate duration from Vyvanse and need for better focus  08/06/22 appt noted: On Mydayis 50 mg AM or Adderall 15 TID.   Better after increase and tolerates it well.   Sonata gives 4 hours.  Not sleepy in the day.   No new complaints. Patient reports stable mood and denies depressed or irritable moods.  Patient denies  any recent difficulty with anxiety.  Patient denies difficulty with sleep initiation or maintenance. Denies appetite disturbance.  Patient reports that energy and motivation have been good.  patient denies any suicidal ideation.  Prior sleep meds tried include:  Ambien which worked but nervous over the withdrawal effects he had in the past.   Restoril 30, Xanax, Diazepam, trazodone hangover,  Sonata brief Mirtazapine 7.5 severe hangover  Other previous psychiatric medications include include Ritalin, Concerta 36, Ritalin SR, Daytrana with skin irritation, Vyvanse 60, Adderall, Mydayis never tried, Strattera  Review of Systems:  Review of Systems  Constitutional:  Negative for fatigue.  Genitourinary:  Negative for difficulty urinating.  Musculoskeletal:  Positive for arthralgias, back pain and joint swelling. Negative for neck pain.  Psychiatric/Behavioral:  Negative for agitation, behavioral problems, confusion, decreased concentration, dysphoric mood, hallucinations, self-injury, sleep disturbance and suicidal ideas. The patient is not nervous/anxious and is not hyperactive.     Medications: Prior to Admission: (Not in a hospital admission)   Current Outpatient Medications  Medication Sig Dispense Refill   amphetamine-dextroamphetamine (ADDERALL) 15 MG tablet Take 1 tablet by mouth 3 (three) times daily. (Patient taking differently: Take 15 mg by mouth 3 (three) times daily. 1 DAILY) 90 tablet 0   levothyroxine (SYNTHROID) 112 MCG tablet Take 112 mcg by mouth every morning.     losartan-hydrochlorothiazide (HYZAAR) 50-12.5 MG tablet Take 1 tablet by mouth daily.     pantoprazole (PROTONIX) 20 MG tablet Take 20 mg by mouth daily.     rosuvastatin (CRESTOR) 5 MG tablet Take 5 mg by mouth daily.     tadalafil (CIALIS) 10 MG tablet TAKE ONE TABLET BY MOUTH DAILY AS NEEDED FOR ERECTILE DYSFUNCTION 90 tablet 0   Tamsulosin HCl (FLOMAX) 0.4 MG CAPS Take by mouth at bedtime.     zaleplon  (SONATA) 10 MG capsule Take 1 capsule (10 mg total) by mouth at bedtime as needed for sleep. 30 capsule 3   Amphet-Dextroamphet 3-Bead ER (MYDAYIS) 50 MG CP24 Take 1 capsule by mouth every morning. 30 capsule 0   [START ON 09/03/2022] Amphet-Dextroamphet 3-Bead ER (MYDAYIS) 50 MG CP24 Take 1 capsule by mouth every morning. 30 capsule 0   [START ON 10/01/2022] Amphet-Dextroamphet 3-Bead ER (MYDAYIS) 50 MG CP24 Take 1 capsule by mouth every morning. 30 capsule 0   amphetamine-dextroamphetamine (ADDERALL) 15 MG tablet Take 1 tablet by mouth 3 (three) times daily. 90 tablet 0   No current facility-administered medications for this visit.    Medication Side Effects: None  Allergies:  Allergies  Allergen Reactions   Morphine And Related Itching    Past Medical History:  Diagnosis Date   Cystic kidney disease 2009   right (pt denies)   Heartburn    Hypercholesterolemia    Microscopic hematuria 2009   Polyp of colon    Prostate cancer (  [facility]) 07/27/12   gleason 3+3=6, vol37 ml   Pulmonary embolism ([facility])    hx   Thyroid disease    hypothyroid    Family History  Problem Relation Age of Onset   Cancer Brother        prostate, completed radiaiton tx 1 year ago   Colon cancer Neg Hx     Social History   Socioeconomic History   Marital status: Married    Spouse name: Not on file   Number of children: Not on file   Years of education: Not on file   Highest education level: Not on file  Occupational History   Not on file  Tobacco Use   Smoking status: Former    Types: Cigarettes    Quit date: 08/18/1987    Years since quitting: 34.9   Smokeless tobacco: Never  Substance and Sexual Activity   Alcohol use: No    Alcohol/week: 0.0 standard drinks of alcohol   Drug use: No   Sexual activity: Not on file  Other Topics Concern   Not on file  Social History Narrative   Not on file   Social Determinants of Health   Financial Resource Strain: Not on file  Food Insecurity: Not  on file  Transportation Needs: Not on file  Physical Activity: Not on file  Stress: Not on file  Social Connections: Not on file  Intimate Partner Violence: Not on file    Past Medical History, Surgical history, Social history, and Family history were reviewed and updated as appropriate.   Please see review of systems for further details on the patient's review from today.   Objective:   Physical Exam:  BP 133/80   Pulse 70   Physical Exam Constitutional:      General: He is not in acute distress.    Appearance: He is obese.  Musculoskeletal:        General: No deformity.  Neurological:     Mental Status: He is alert and oriented to person, place, and time.     Coordination: Coordination normal.     Gait: Gait normal.  Psychiatric:        Attention and Perception: Attention normal.        Mood and Affect: Mood is not anxious or depressed. Affect is not labile or inappropriate.        Speech: Speech normal.        Behavior: Behavior normal.        Thought Content: Thought content normal. Thought content is not delusional. Thought content does not include homicidal or suicidal ideation. Thought content does not include suicidal plan.        Cognition and Memory: Cognition normal.        Judgment: Judgment normal.     Comments: Insight intact. No auditory or visual hallucinations. No delusions.      Lab Review:  No results found for: "NA", "K", "CL", "CO2", "GLUCOSE", "BUN", "CREATININE", "CALCIUM", "PROT", "ALBUMIN", "AST", "ALT", "ALKPHOS", "BILITOT", "GFRNONAA", "GFRAA"  No results found for: "WBC", "RBC", "HGB", "HCT", "PLT", "MCV", "MCH", "MCHC", "RDW", "LYMPHSABS", "MONOABS", "EOSABS", "BASOSABS"  No results found for: "POCLITH", "LITHIUM"   No results found for: "PHENYTOIN", "PHENOBARB", "VALPROATE", "CBMZ"   .res Assessment: Plan:    Nassim was seen today for follow-up and adhd.  Diagnoses and all orders for this visit:  Attention deficit hyperactivity  disorder (ADHD), combined type -     Amphet-Dextroamphet 3-Bead ER (MYDAYIS) 50 MG CP24; Take 1 capsule by  mouth every morning. -     amphetamine-dextroamphetamine (ADDERALL) 15 MG tablet; Take 1 tablet by mouth 3 (three) times daily. -     Amphet-Dextroamphet 3-Bead ER (MYDAYIS) 50 MG CP24; Take 1 capsule by mouth every morning. -     Amphet-Dextroamphet 3-Bead ER (MYDAYIS) 50 MG CP24; Take 1 capsule by mouth every morning.  Insomnia due to mental condition  Essential hypertension  Reviewed stimulant types and options.  Options gabapentin, Intermezzo, trazodone, mirtazapine, sonata, amitriptyline, Seroquel and disc options and SE each  Sleep is better pren Sonata 5-10 helps  Discussed potential benefits, risks, and side effects of stimulants with patient to include increased heart rate, palpitations, insomnia, increased anxiety, increased irritability, or decreased appetite.  Instructed patient to contact office if experiencing any significant tolerability issues.  continue Mydayis 50 mg daily bc inadequate duration from Vyvanse and need for better focus Otherwise using Adderall in Am   4 mos  Lynder Parents, MD, DFAPA   Please see After Visit Summary for patient specific instructions.  Future Appointments  Date Time Provider [place]  02/04/2023  4:30 PM Cottle, Billey Co., MD [facility] None      No orders of the defined types were placed in this encounter.      -------------------------------

## 2022-09-23 ENCOUNTER — Other Ambulatory Visit: Payer: Self-pay | Admitting: Psychiatry

## 2022-09-23 ENCOUNTER — Telehealth: Payer: Self-pay | Admitting: Psychiatry

## 2022-09-23 MED ORDER — AMPHETAMINE-DEXTROAMPHET ER 30 MG PO CP24: 30.0000 mg | ORAL_CAPSULE | Freq: Every day | ORAL | 0 refills | Status: DC

## 2022-09-23 NOTE — Telephone Encounter (Signed)
TC: Has felt that the effectiveness of Adderall is better than Mydayis or Vyvanse and wants to try Adderall XR.  We will send in Adderall XR 30 mg capsule in place of the Mydayis.

## 2022-10-30 ENCOUNTER — Other Ambulatory Visit: Payer: Self-pay

## 2022-10-30 ENCOUNTER — Telehealth: Payer: Self-pay | Admitting: Psychiatry

## 2022-10-30 DIAGNOSIS — F902 Attention-deficit hyperactivity disorder, combined type: Secondary | ICD-10-CM

## 2022-10-30 MED ORDER — AMPHETAMINE-DEXTROAMPHET ER 30 MG PO CP24: 30.0000 mg | ORAL_CAPSULE | Freq: Every day | ORAL | 0 refills | Status: DC

## 2022-10-30 NOTE — Telephone Encounter (Signed)
Maurice Gomez called at 11:00 to request refill of her Adderall.  Appt 5/21. [facility] [address]  He is out as of today.

## 2022-10-30 NOTE — Telephone Encounter (Signed)
Pended.

## 2022-11-19 ENCOUNTER — Other Ambulatory Visit: Payer: Self-pay

## 2022-11-19 ENCOUNTER — Telehealth: Payer: Self-pay | Admitting: Psychiatry

## 2022-11-19 DIAGNOSIS — F902 Attention-deficit hyperactivity disorder, combined type: Secondary | ICD-10-CM

## 2022-11-19 MED ORDER — AMPHETAMINE-DEXTROAMPHETAMINE 15 MG PO TABS: 15.0000 mg | ORAL_TABLET | Freq: Three times a day (TID) | ORAL | 0 refills | Status: DC

## 2022-11-19 NOTE — Telephone Encounter (Signed)
Pended.

## 2022-11-19 NOTE — Telephone Encounter (Signed)
LF 11/24

## 2022-11-19 NOTE — Telephone Encounter (Signed)
Patient called in for refill on Adderall '15mg'$ . States that he is out. Ph:[phone]

## 2022-11-21 ENCOUNTER — Telehealth: Payer: Self-pay | Admitting: Psychiatry

## 2022-11-21 NOTE — Telephone Encounter (Signed)
Let me check and I will initiate if not

## 2022-11-21 NOTE — Telephone Encounter (Signed)
See addendum to previous. Ric called stating if the insurance company had faxed a prior authorization on the Adderall 15 and 30 mg yet. He has gone several days without the Adderall & is just checking on the status. His number is [phone].

## 2022-11-21 NOTE — Telephone Encounter (Signed)
See other note. NO PA needed.

## 2022-11-21 NOTE — Telephone Encounter (Signed)
I contacted the pharmacy and they do NOT need a prior authorization it has already been taken care of. They were clarifying doses only and actually spoke with Dr. Clovis Pu today concerning the dose.

## 2022-11-21 NOTE — Telephone Encounter (Signed)
Ebony Hail, Pharmacist, at [facility] @ 9:41a.  She said she previously requested a call back this week to clarify information on the Adderall script for pt.  Pls call back to [phone] as pt is asking for refill to be done today.  Next appt 5/21

## 2022-11-21 NOTE — Telephone Encounter (Signed)
Ric has called me at least 2 times today. He said he needs a refill on his Adderall 15 mg and 30 mg. He said both of these medications require a prior authorization and states that insurance has faxed over to [place]. They need to confirm that he is on these medications? Did you receive a prior auth for him? Do this when you can Traci. Sorry to send this to you.

## 2022-11-21 NOTE — Telephone Encounter (Signed)
No PA received. Will contact pharmacy for clarification.

## 2022-11-21 NOTE — Telephone Encounter (Signed)
Done

## 2022-12-03 ENCOUNTER — Telehealth: Payer: Self-pay | Admitting: Psychiatry

## 2022-12-03 ENCOUNTER — Other Ambulatory Visit: Payer: Self-pay | Admitting: Psychiatry

## 2022-12-03 MED ORDER — ZALEPLON 10 MG PO CAPS: 10.0000 mg | ORAL_CAPSULE | Freq: Every evening | ORAL | 3 refills | Status: DC | PRN

## 2022-12-03 NOTE — Telephone Encounter (Signed)
Pt called and needs a refill on his zaleplon 10 mg [facility] [address]

## 2022-12-06 ENCOUNTER — Other Ambulatory Visit: Payer: Self-pay | Admitting: Psychiatry

## 2022-12-06 ENCOUNTER — Telehealth: Payer: Self-pay | Admitting: Psychiatry

## 2022-12-06 MED ORDER — AMPHETAMINE-DEXTROAMPHET ER 30 MG PO CP24: 30.0000 mg | ORAL_CAPSULE | Freq: Every day | ORAL | 0 refills | Status: DC

## 2022-12-06 NOTE — Telephone Encounter (Signed)
Pt called at 1:15p.  He is requesting refill of Adderall XR 30mg  sent to [facility].  It is in stock there.  Next appt 5/21

## 2022-12-06 NOTE — Telephone Encounter (Signed)
Completed.

## 2023-01-11 IMAGING — MR MR PROSTATE WO/W CM
12 series · 48 of 48 positions shown · IV contrast (multihance)
Comparison: 01/05/2020

CLINICAL DATA: Atypical glands suspicious for low-grade carcinoma
in the right lateral mid gland on biopsy of 01/22/2021. PSA level of
6.87 on 07/23/2021.

EXAM:
MR PROSTATE WITHOUT AND WITH CONTRAST
TECHNIQUE: Multiplanar multisequence MRI images were obtained of the pelvis
centered about the prostate. Pre and post contrast images were
obtained.
CONTRAST:  20mL MULTIHANCE GADOBENATE DIMEGLUMINE 529 MG/ML IV SOLN

[Series 3: T2 · coronal · 3.0mm · 0.56mm/px · 1 of 23 slices shown (1 of 3)]
[im 1/23]
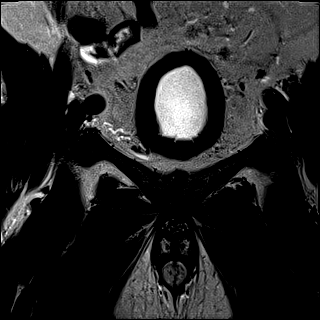

[Series 4: T1 · axial · 5.0mm · 1.25mm/px · 1 of 88 slices shown]
[im 1/88]
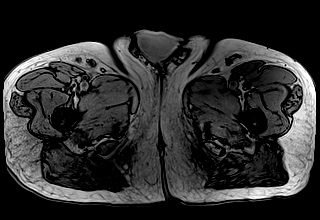

[Series 5: DWI · axial · 3.0mm · 1.75mm/px · 1 of 72 slices shown (1 of 3)]
[im 1/72]
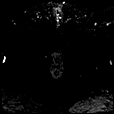

[Series 6: DWI · axial · 3.0mm · 1.75mm/px · 1 of 24 slices shown (2 of 3)]
[im 1/24]
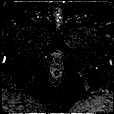

[Series 7: DWI · axial · 3.0mm · 1.75mm/px · 1 of 24 slices shown (3 of 3)]
[im 1/24]
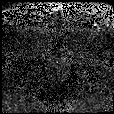

[Series 8: T2 · axial · 3.0mm · 0.56mm/px · 1 of 25 slices shown (2 of 3)]
[im 1/25]
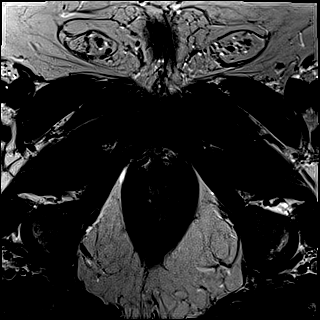

[Series 9: T2 · axial · 1.0mm · 1.04mm/px · z∈[+32,+103]mm · 2 of 72 slices shown (3 of 3)]
[im 1/72]
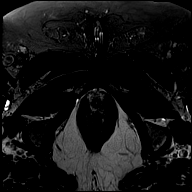
[im 72/72]
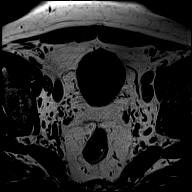

[Series 10: pre t1_twist_tra_dyn · axial · non-contrast · 3.5mm · 0.83mm/px · 1 of 22 slices shown]
[im 1/22]
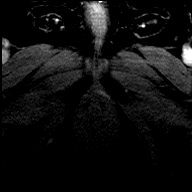

[Series 11: post t1_twist_tra_dyn-copy center · axial · non-contrast · 3.5mm · 0.83mm/px · z∈[+31,+105]mm · 18 of 660 slices shown]
[im 1/660]
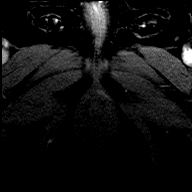
[im 39/660]
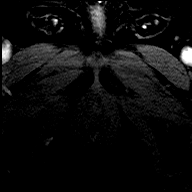
[im 78/660]
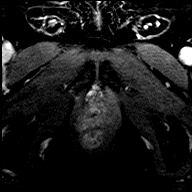
[im 117/660]
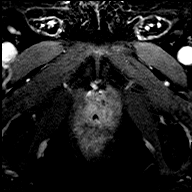
[im 156/660]
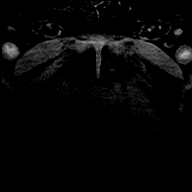
[im 194/660]
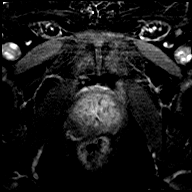
[im 233/660]
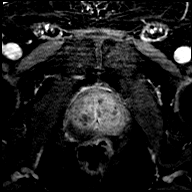
[im 272/660]
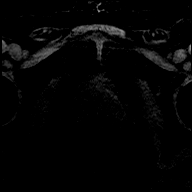
[im 311/660]
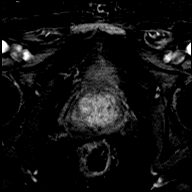
[im 349/660]
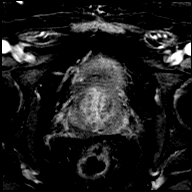
[im 388/660]
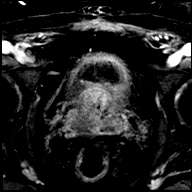
[im 427/660]
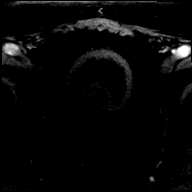
[im 466/660]
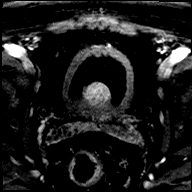
[im 504/660]
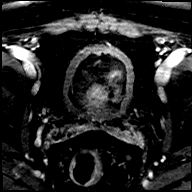
[im 543/660]
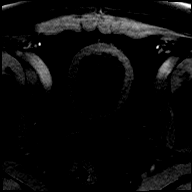
[im 582/660]
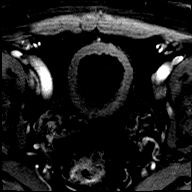
[im 621/660]
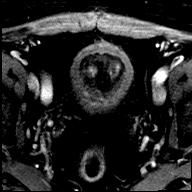
[im 660/660]
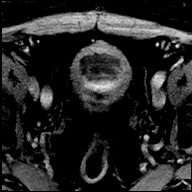

[Series 12: post t1_twist_tra_dyn-copy cent_sub · axial · 3.5mm · 0.83mm/px · z∈[+31,+105]mm · 17 of 636 slices shown]
[im 1/636]
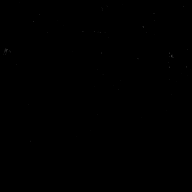
[im 40/636]
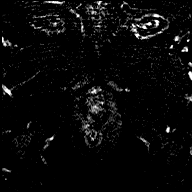
[im 80/636]
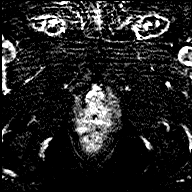
[im 120/636]
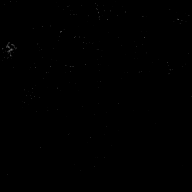
[im 159/636]
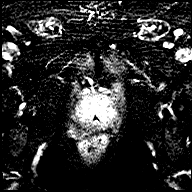
[im 199/636]
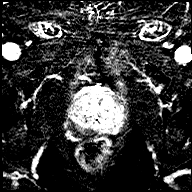
[im 239/636]
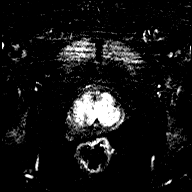
[im 278/636]
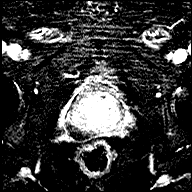
[im 318/636]
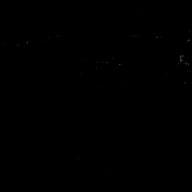
[im 358/636]
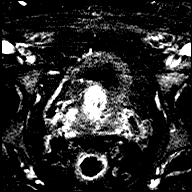
[im 397/636]
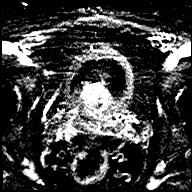
[im 437/636]
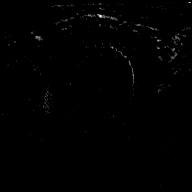
[im 477/636]
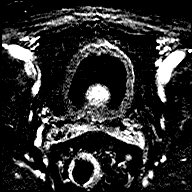
[im 516/636]
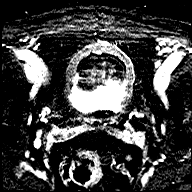
[im 556/636]
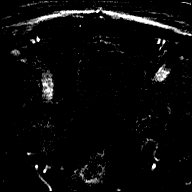
[im 596/636]
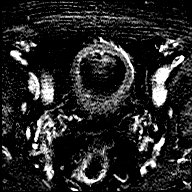
[im 636/636]
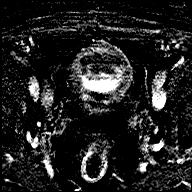

[Series 13: t1_vibe_dixon_tra_f · axial · 2.5mm · 0.91mm/px · z∈[-7,+190]mm · 2 of 80 slices shown]
[im 1/80]
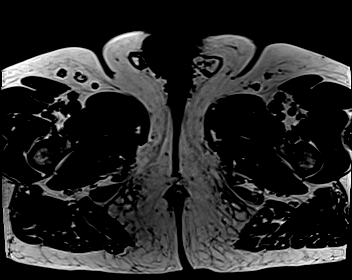
[im 80/80]
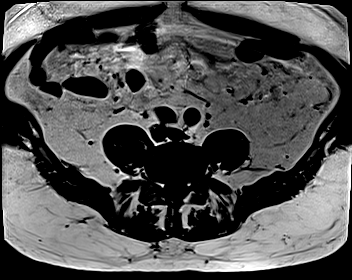

[Series 14: t1_vibe_dixon_tra_w · axial · 2.5mm · 0.91mm/px · z∈[-7,+190]mm · 2 of 80 slices shown]
[im 1/80]
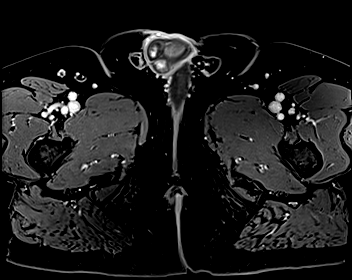
[im 80/80]
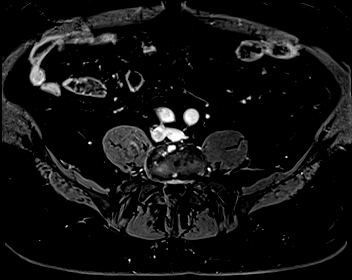

[48 of 48 positions shown; findings below may reference images not displayed]

FINDINGS: Prostate: Encapsulated nodularity in the transition zone compatible
with benign prostatic hypertrophy. Prominent median lobe indents
bladder base.

As before, there is substantial thinning of the left peripheral zone
which is probably postinflammatory, with accompanying hazy nonfocal
low T2 signal. No restricted diffusion in the prostate gland. No
significant abnormal focal early enhancement in the peripheral zone.
No lesion of intermediate or higher suspicion for prostate cancer is
identified.

Volume: 3D volumetric analysis: Prostate volume 55.98 cc (5.4 by
by 5.8 cm).

Transcapsular spread:  Absent

Seminal vesicle involvement: Absent

Neurovascular bundle involvement: Absent

Pelvic adenopathy: Absent

Bone metastasis: Absent

Other findings: Mild sigmoid colon diverticulosis.
IMPRESSION: 1. No findings of intermediate or higher suspicion for prostate
cancer identified.
2. Encapsulated nodularity in the transition zone compatible with
benign prostatic hypertrophy. Mild prostatomegaly.
3. Stable appearance of thinned peripheral zone on the left side,
likely postinflammatory.
4. Mild sigmoid colon diverticulosis.

## 2023-01-16 ENCOUNTER — Other Ambulatory Visit: Payer: Self-pay

## 2023-01-16 ENCOUNTER — Telehealth: Payer: Self-pay | Admitting: Psychiatry

## 2023-01-16 MED ORDER — AMPHETAMINE-DEXTROAMPHET ER 30 MG PO CP24: 30.0000 mg | ORAL_CAPSULE | Freq: Every day | ORAL | 0 refills | Status: DC

## 2023-01-16 NOTE — Telephone Encounter (Signed)
Pended.

## 2023-01-16 NOTE — Telephone Encounter (Signed)
[facility] advised [facility] [address] [facility] in stock generic adderall XR 30 mg. Send Rx this location. Advise Pt when send. [phone]

## 2023-01-16 NOTE — Telephone Encounter (Signed)
I sent this yesterday I thought.  I don't see it pended

## 2023-01-19 ENCOUNTER — Other Ambulatory Visit: Payer: Self-pay | Admitting: Psychiatry

## 2023-01-19 DIAGNOSIS — F902 Attention-deficit hyperactivity disorder, combined type: Secondary | ICD-10-CM

## 2023-01-28 ENCOUNTER — Other Ambulatory Visit: Payer: Self-pay | Admitting: Psychiatry

## 2023-01-28 ENCOUNTER — Telehealth: Payer: Self-pay | Admitting: Psychiatry

## 2023-01-28 DIAGNOSIS — F902 Attention-deficit hyperactivity disorder, combined type: Secondary | ICD-10-CM

## 2023-01-28 MED ORDER — AMPHETAMINE-DEXTROAMPHETAMINE 15 MG PO TABS: 15.0000 mg | ORAL_TABLET | Freq: Three times a day (TID) | ORAL | 0 refills | Status: DC

## 2023-01-28 NOTE — Telephone Encounter (Signed)
Pt called at 4:58p.  He would like refill of Adderall 15mg  sent to   [id number] [place], [place] - [address] [place] [address] Phone: [phone]  Fax: [phone]   Next appt 5/21

## 2023-02-04 ENCOUNTER — Ambulatory Visit: Payer: 59 | Admitting: Psychiatry

## 2023-02-04 ENCOUNTER — Encounter: Payer: Self-pay | Admitting: Psychiatry

## 2023-02-04 DIAGNOSIS — I10 Essential (primary) hypertension: Secondary | ICD-10-CM | POA: Diagnosis not present

## 2023-02-04 DIAGNOSIS — F902 Attention-deficit hyperactivity disorder, combined type: Secondary | ICD-10-CM

## 2023-02-04 DIAGNOSIS — F5105 Insomnia due to other mental disorder: Secondary | ICD-10-CM | POA: Diagnosis not present

## 2023-02-04 MED ORDER — AMPHETAMINE-DEXTROAMPHETAMINE 15 MG PO TABS: 15.0000 mg | ORAL_TABLET | Freq: Two times a day (BID) | ORAL | 0 refills | Status: DC

## 2023-02-04 MED ORDER — AMPHETAMINE-DEXTROAMPHET ER 30 MG PO CP24: 30.0000 mg | ORAL_CAPSULE | Freq: Every day | ORAL | 0 refills | Status: DC

## 2023-02-04 MED ORDER — AMPHETAMINE-DEXTROAMPHETAMINE 30 MG PO TABS: 30.0000 mg | ORAL_TABLET | Freq: Every day | ORAL | 0 refills | Status: DC

## 2023-02-04 NOTE — Progress Notes (Signed)
Maurice Gomez [id number] [birth year] 67 y.o.    Subjective:   Patient ID:  Maurice Gomez is a 67 y.o. (DOB [birth year]) male.  Chief Complaint:  Chief Complaint  Patient presents with   Follow-up   ADD    HPI Maurice Gomez presents to the office today for follow-up of ADD.    Increased Vyvanse to 60 mg 2019.  seen March 04, 2019.  No meds were changed.  He continued Vyvanse 60 mg daily.  Oral days when he forgot Vyvanse he would take Adderall. Also variety of tx for sleep have been used.  02/2020 appt noted: Working long hours.  Busy but OK.  Maurice Gomez still sleeps until noon and has had some heart issues.  She's had MI.   Sleep is better but not great.   4 to 4& 1/2 hours sleep and not tired. Vyvanse only lasts about 6 hours now.  Problems with sleep.  No problems going. Awaken after 4 hours and difficult to go back to sleep.  Gettting worse and going on for a year.  Misses the lack of sleep. Temazepam inconsistently keeps him asleep and doesn't want to take it all the time DT feeling drugged.  Doesn't believe related to stimulant bc independent of vyvanse use.  Never comfortable when he sleeps. Some pain issues, laying.  Tried new mattress.  Tried sonata at onset but awoke.  Also has to get up to urinate. Plan: Try Mydayis 50 bc inadequate duration from Vyvanse. Otherwise using Adderall in Am and Vyvanse 60 at noon.  10/23/2020 appointment with the following noted: Mydayis never tried.   Been a little worried about weight and BP.  BP got elevated with weight gain. BP usually 140-150/85-95. Dx OSA AHI 36 and can't get CPAP for 3 mos DT chip shortage.  Loud snoring and nonrestorative sleep and fatigue ADD would like longer duration. Patient reports stable mood and denies depressed or irritable moods.  Patient denies any recent difficulty with anxiety. Denies appetite disturbance.  Patient reports that energy and motivation have been good.    Patient denies any  suicidal ideation. Plan: Try Mydayis 50 bc inadequate duration from Vyvanse. Otherwise using Adderall in Am and Vyvanse 60 at noon.  04/24/2021 appointment with the following noted: SP Covid this summer.  Still testing positive.  Still some loss of taste and smell and slight cough.   Maurice Gomez and Maurice Gomez never tested positive or got sick. Also had period of back problem. Likes Mydayis with better duration but still not 16 hours. 50 mg was too much. Still needs Adderall bc if forgets Mydayis can take it later. Vyvanse super effective but crashes midday.  Mydayis less effective without crash and works. Change BP meds and it's managed. Plan: Mydayis 37.5 mg daily bc inadequate duration from Vyvanse.  10/24/2021 appointment with the following noted: Likes Mydayis duration better but not.  Felt the amphetamine a bit more with Vyvanse.   Sleep is not as good as should be.  Struggle with CPAP.  Using nasal pillows.  Was off for 2 mos.  Restarted 3 weeks ago.  It leaks.   Sleeps probably 4 hours and sometimes less.  Not comfortable in bed.  Restored initially when wakes and then gets tired. Patient reports stable mood and denies depressed or irritable moods.  Patient denies any recent difficulty with anxiety.  Denies appetite disturbance.  Patient reports that energy and motivation have been good.   Patient denies any suicidal ideation.  02/19/22  appt noted: On Mydayis 37.5 mg daily or Adderall. No SE Focus drifting a little in a way it has not in years.  Took D's vyvanse 60 and it seemed better. Takes Adderall if forgets Mydayis. Not using CPAP.   Plan: Increase Mydayis 50 mg daily bc inadequate duration from Vyvanse and need for better focus  08/06/22 appt noted: On Mydayis 50 mg AM or Adderall 15 TID.   Better after increase and tolerates it well.   Sonata gives 4 hours.  Not sleepy in the day.   No new complaints. No changes  02/04/23 appt noted: Meds: lately Adderall Xr 30 AM, and some Adderall 15  mg typically BID- TID in place of Mydayis. Sleep better with Adderall vs Mydayis Working . Meds Zaleplon for EMA. Continue meds and satisfied. No SE.   Patient reports stable mood and denies depressed or irritable moods.  Patient denies any recent difficulty with anxiety.  Patient denies difficulty with sleep initiation or maintenance. Denies appetite disturbance.  Patient reports that energy and motivation have been good.  patient denies any suicidal ideation.  Prior sleep meds tried include:  Ambien which worked but nervous over the withdrawal effects he had in the past.   Restoril 30, Xanax, Diazepam, trazodone hangover,  Sonata brief Mirtazapine 7.5 severe hangover  Other previous psychiatric medications include include Ritalin, Concerta 36, Ritalin SR, Daytrana with skin irritation, Vyvanse 60, Adderall, Mydayis never tried, Strattera  Review of Systems:  Review of Systems  Constitutional:  Negative for fatigue.  Genitourinary:  Negative for difficulty urinating.  Musculoskeletal:  Positive for arthralgias, back pain and joint swelling. Negative for neck pain.  Neurological:  Negative for weakness.  Psychiatric/Behavioral:  Negative for agitation, behavioral problems, confusion, decreased concentration, dysphoric mood, hallucinations, self-injury, sleep disturbance and suicidal ideas. The patient is not nervous/anxious and is not hyperactive.     Medications: Prior to Admission: (Not in a hospital admission)   Current Outpatient Medications  Medication Sig Dispense Refill   Amphet-Dextroamphet 3-Bead ER (MYDAYIS) 50 MG CP24 Take 1 capsule by mouth every morning. 30 capsule 0   amphetamine-dextroamphetamine (ADDERALL XR) 30 MG 24 hr capsule Take 1 capsule (30 mg total) by mouth daily. 30 capsule 0   amphetamine-dextroamphetamine (ADDERALL) 15 MG tablet Take 1 tablet by mouth 3 (three) times daily. 90 tablet 0   levothyroxine (SYNTHROID) 112 MCG tablet Take 112 mcg by mouth every  morning.     losartan-hydrochlorothiazide (HYZAAR) 50-12.5 MG tablet Take 1 tablet by mouth daily.     pantoprazole (PROTONIX) 20 MG tablet Take 20 mg by mouth daily.     rosuvastatin (CRESTOR) 5 MG tablet Take 5 mg by mouth daily.     tadalafil (CIALIS) 10 MG tablet TAKE ONE TABLET BY MOUTH DAILY AS NEEDED FOR ERECTILE DYSFUNCTION 90 tablet 0   Tamsulosin HCl (FLOMAX) 0.4 MG CAPS Take by mouth at bedtime.     zaleplon (SONATA) 10 MG capsule Take 1 capsule (10 mg total) by mouth at bedtime as needed for sleep. 30 capsule 3   No current facility-administered medications for this visit.    Medication Side Effects: None  Allergies:  Allergies  Allergen Reactions   Morphine And Codeine Itching    Past Medical History:  Diagnosis Date   Cystic kidney disease 2009   right (pt denies)   Heartburn    Hypercholesterolemia    Microscopic hematuria 2009   Polyp of colon    Prostate cancer (HCC) 07/27/12   gleason  3+3=6, vol37 ml   Pulmonary embolism (HCC)    hx   Thyroid disease    hypothyroid    Family History  Problem Relation Age of Onset   Cancer Brother        prostate, completed radiaiton tx 1 year ago   Colon cancer Neg Hx     Social History   Socioeconomic History   Marital status: Married    Spouse name: Not on file   Number of children: Not on file   Years of education: Not on file   Highest education level: Not on file  Occupational History   Not on file  Tobacco Use   Smoking status: Former    Types: Cigarettes    Quit date: 08/18/1987    Years since quitting: 35.4   Smokeless tobacco: Never  Substance and Sexual Activity   Alcohol use: No    Alcohol/week: 0.0 standard drinks of alcohol   Drug use: No   Sexual activity: Not on file  Other Topics Concern   Not on file  Social History Narrative   Not on file   Social Determinants of Health   Financial Resource Strain: Not on file  Food Insecurity: Not on file  Transportation Needs: Not on file   Physical Activity: Not on file  Stress: Not on file  Social Connections: Not on file  Intimate Partner Violence: Not on file    Past Medical History, Surgical history, Social history, and Family history were reviewed and updated as appropriate.   Please see review of systems for further details on the patient's review from today.   Objective:   Physical Exam:  There were no vitals taken for this visit.  Physical Exam Constitutional:      General: He is not in acute distress.    Appearance: He is obese.  Musculoskeletal:        General: No deformity.  Neurological:     Mental Status: He is alert and oriented to person, place, and time.     Coordination: Coordination normal.     Gait: Gait normal.  Psychiatric:        Attention and Perception: Attention normal.        Mood and Affect: Mood is not anxious or depressed. Affect is not inappropriate.        Speech: Speech normal.        Behavior: Behavior normal.        Thought Content: Thought content normal. Thought content is not delusional. Thought content does not include homicidal or suicidal ideation. Thought content does not include suicidal plan.        Cognition and Memory: Cognition normal.        Judgment: Judgment normal.     Comments: Insight intact. No auditory or visual hallucinations. No delusions.      Lab Review:  No results found for: "NA", "K", "CL", "CO2", "GLUCOSE", "BUN", "CREATININE", "CALCIUM", "PROT", "ALBUMIN", "AST", "ALT", "ALKPHOS", "BILITOT", "GFRNONAA", "GFRAA"  No results found for: "WBC", "RBC", "HGB", "HCT", "PLT", "MCV", "MCH", "MCHC", "RDW", "LYMPHSABS", "MONOABS", "EOSABS", "BASOSABS"  No results found for: "POCLITH", "LITHIUM"   No results found for: "PHENYTOIN", "PHENOBARB", "VALPROATE", "CBMZ"   .res Assessment: Plan:    Maurice Gomez was seen today for follow-up and add.  Diagnoses and all orders for this visit:  Attention deficit hyperactivity disorder (ADHD), combined  type  Insomnia due to mental condition  Essential hypertension  Reviewed stimulant types and options.  Options gabapentin, Intermezzo, trazodone, mirtazapine, sonata,  amitriptyline, Seroquel and disc options and SE each  Sleep is better pren Sonata 5-10 helps  Discussed potential benefits, risks, and side effects of stimulants with patient to include increased heart rate, palpitations, insomnia, increased anxiety, increased irritability, or decreased appetite.  Instructed patient to contact office if experiencing any significant tolerability issues.  Continue Adderall XR 30 AM and Adderall 15 BID-TID  6 mos  Meredith Staggers, MD, DFAPA   Please see After Visit Summary for patient specific instructions.  No future appointments.     No orders of the defined types were placed in this encounter.      -------------------------------

## 2023-02-26 ENCOUNTER — Other Ambulatory Visit: Payer: Self-pay | Admitting: Psychiatry

## 2023-02-26 ENCOUNTER — Telehealth: Payer: Self-pay | Admitting: Psychiatry

## 2023-02-26 MED ORDER — AMPHETAMINE-DEXTROAMPHET ER 30 MG PO CP24: 30.0000 mg | ORAL_CAPSULE | Freq: Every day | ORAL | 0 refills | Status: DC

## 2023-02-26 NOTE — Telephone Encounter (Signed)
Pt called at 3:40p.  He said he needs the script from 5/21 corrected to be Adderall XR 30mg   capsules.  The script from 5/21 can't be picked up because it's not the XR version of pills and he can't wait until 6/18 to get the correct version.  [facility]s [facility] to [facility].  Next appt 11/25

## 2023-02-26 NOTE — Telephone Encounter (Signed)
sent 

## 2023-03-26 ENCOUNTER — Other Ambulatory Visit: Payer: Self-pay | Admitting: Psychiatry

## 2023-05-16 ENCOUNTER — Telehealth: Payer: Self-pay | Admitting: Psychiatry

## 2023-05-16 ENCOUNTER — Other Ambulatory Visit: Payer: Self-pay

## 2023-05-16 DIAGNOSIS — F902 Attention-deficit hyperactivity disorder, combined type: Secondary | ICD-10-CM

## 2023-05-16 MED ORDER — AMPHETAMINE-DEXTROAMPHET ER 30 MG PO CP24
30.0000 mg | ORAL_CAPSULE | Freq: Every day | ORAL | 0 refills | Status: DC
Start: 2023-05-16 — End: 2023-08-11

## 2023-05-16 MED ORDER — AMPHETAMINE-DEXTROAMPHETAMINE 15 MG PO TABS
15.0000 mg | ORAL_TABLET | Freq: Two times a day (BID) | ORAL | 0 refills | Status: DC
Start: 2023-05-16 — End: 2023-08-11

## 2023-05-16 NOTE — Telephone Encounter (Signed)
Patient called in with refill request for Amphetamine 30mg  and Adderall 15mg . States pharmacy didn't have prescription on file. Ph: [phone] Appt 11/25 [facility] [address] [place]

## 2023-05-16 NOTE — Telephone Encounter (Signed)
Pended.

## 2023-05-23 ENCOUNTER — Other Ambulatory Visit: Payer: Self-pay | Admitting: Psychiatry

## 2023-05-23 MED ORDER — AMPHETAMINE-DEXTROAMPHET ER 30 MG PO CP24: 30.0000 mg | ORAL_CAPSULE | Freq: Every day | ORAL | 0 refills | Status: DC

## 2023-07-15 ENCOUNTER — Telehealth: Payer: Self-pay | Admitting: Psychiatry

## 2023-07-15 NOTE — Telephone Encounter (Signed)
LF 10/1 LV 05/21

## 2023-07-15 NOTE — Telephone Encounter (Signed)
Ric called at 1:45 for this same request.  Has appt 11/25.  He said he needs to be able to get it today if possible.  [facility] [address]

## 2023-08-01 ENCOUNTER — Ambulatory Visit (INDEPENDENT_AMBULATORY_CARE_PROVIDER_SITE_OTHER): Payer: 59 | Admitting: Professional Counselor

## 2023-08-01 ENCOUNTER — Encounter: Payer: Self-pay | Admitting: Professional Counselor

## 2023-08-01 DIAGNOSIS — F4322 Adjustment disorder with anxiety: Secondary | ICD-10-CM
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
## 2023-08-08 ENCOUNTER — Encounter: Payer: Self-pay | Admitting: Professional Counselor

## 2023-08-08 ENCOUNTER — Ambulatory Visit: Payer: 59 | Admitting: Professional Counselor

## 2023-08-08 DIAGNOSIS — F4322 Adjustment disorder with anxiety: Secondary | ICD-10-CM | POA: Diagnosis not present

## 2023-08-08 NOTE — Progress Notes (Signed)
      [facility] Counselor/Therapist Progress Note  Patient ID: Maurice Gomez, MRN: [id number],    Date: 11.22.2024  Time Spent: 1:10 PM to 2:13 PM  Treatment Type: Individual Therapy  Reported Symptoms: Stress, worries, restlessness, low mood, interpersonal concerns  Mental Status Exam:  Appearance:   Neat     Behavior:  Appropriate, Sharing, and Motivated  Motor:  Normal  Speech/Language:   Clear and Coherent and Normal Rate  Affect:  Appropriate and Congruent  Mood:  normal  Thought process:  normal  Thought content:    WNL  Sensory/Perceptual disturbances:    WNL  Orientation:  Sound  Attention:  Good  Concentration:  Good  Memory:  WNL  Fund of knowledge:   Good  Insight:    Good  Judgment:   Good  Impulse Control:  Good   Risk Assessment: Danger to Self:  No Self-injurious Behavior: No Danger to Others: No Duty to Warn:no Physical Aggression / Violence:No  Access to Firearms a concern: No  Gang Involvement:No   Subjective: Patient presented to session to address concerns of anxiety per challenging family circumstance.  Patient reported spouse to have transferred to new facility and program, and for transition to be difficult where their relationship dynamics concerned.  Patient processed the experience and the triggers that came up for him as a result of years of difficulty and enabling around his management of specific family concerns.  Patient identified his experience of being believed in at critical times in his life, that he feels as part of his motivation and overarching desire to always be there for others, most of all his family, however he voiced that he is reflecting on the boundaries necessary to this orientation.  Patient identified his strengths and support system at this time, expressing gratitude, and a positive focus in spite of strain.  Interventions: Solution-Oriented/Positive Psychology, Humanistic/Existential, and  Insight-Oriented  Diagnosis:   ICD-10-CM   1. Adjustment disorder with anxiety  F43.22       Plan: Patient is scheduled for follow-up; continue process work and developing coping skills; discuss and obtain consent for treatment plan.  Gaspar Bidding, [facility]

## 2023-08-11 ENCOUNTER — Ambulatory Visit: Payer: 59 | Admitting: Psychiatry

## 2023-08-11 MED ORDER — AMPHETAMINE-DEXTROAMPHETAMINE 15 MG PO TABS
15.0000 mg | ORAL_TABLET | Freq: Two times a day (BID) | ORAL | 0 refills | Status: DC
Start: 2023-09-08 — End: 2023-12-02

## 2023-08-11 MED ORDER — AMPHETAMINE-DEXTROAMPHET ER 30 MG PO CP24
30.0000 mg | ORAL_CAPSULE | Freq: Every day | ORAL | 0 refills | Status: DC
Start: 2023-08-11 — End: 2023-10-24

## 2023-08-11 MED ORDER — AMPHETAMINE-DEXTROAMPHETAMINE 15 MG PO TABS
15.0000 mg | ORAL_TABLET | Freq: Two times a day (BID) | ORAL | 0 refills | Status: DC
Start: 2023-08-11 — End: 2023-10-24

## 2023-08-11 MED ORDER — AMPHETAMINE-DEXTROAMPHET ER 30 MG PO CP24
30.0000 mg | ORAL_CAPSULE | Freq: Every day | ORAL | 0 refills | Status: DC
Start: 2023-09-08 — End: 2023-12-02

## 2023-08-11 NOTE — Progress Notes (Signed)
Appt cancelled by office per pt request.

## 2023-08-15 ENCOUNTER — Encounter: Payer: Self-pay | Admitting: Professional Counselor

## 2023-09-16 ENCOUNTER — Ambulatory Visit: Payer: 59 | Admitting: Professional Counselor

## 2023-09-25 ENCOUNTER — Ambulatory Visit: Payer: 59 | Admitting: Professional Counselor

## 2023-09-25 ENCOUNTER — Telehealth: Payer: Self-pay | Admitting: Psychiatry

## 2023-09-25 ENCOUNTER — Other Ambulatory Visit: Payer: Self-pay

## 2023-09-25 DIAGNOSIS — F4322 Adjustment disorder with anxiety: Secondary | ICD-10-CM | POA: Diagnosis not present

## 2023-09-25 NOTE — Telephone Encounter (Signed)
 Pt called and said that he needs a refill on his zaleplon 10 mg. [facility] [place]

## 2023-09-25 NOTE — Telephone Encounter (Signed)
 Please call to schedule FU, last seen in May with RTC in 6 mo.

## 2023-09-25 NOTE — Progress Notes (Signed)
      [facility] Counselor/Therapist Progress Note  Patient ID: KELDRICK POMPLUN, MRN: [id number],    Date: 09/25/2023  Time Spent: 9:12 AM to 10:10 AM  Treatment Type: Individual Therapy  Reported Symptoms: Worries, restlessness, stress, caregiver strain, interpersonal concerns  Mental Status Exam:  Appearance:   Neat     Behavior:  Appropriate, Sharing, and Motivated  Motor:  Normal  Speech/Language:   Clear and Coherent and Normal Rate  Affect:  Appropriate and Congruent  Mood:  normal  Thought process:  normal  Thought content:    WNL  Sensory/Perceptual disturbances:    WNL  Orientation:  oriented to person, place, time/date, and situation  Attention:  Good  Concentration:  Good  Memory:  WNL  Fund of knowledge:   Good  Insight:    Good  Judgment:   Good  Impulse Control:  Good   Risk Assessment: Danger to Self:  No Self-injurious Behavior: No Danger to Others: No Duty to Warn:no Physical Aggression / Violence:No  Access to Firearms a concern: No Gang Involvement:No   Subjective: Patient presented to session to address concerns of anxiety.  He reported mixed progress.  He reported having been attending Al-Anon meetings per family member's struggle with addiction and impact on his wellbeing.  Patient voiced working to remember that situation is not his fault, and not personal, and being mindful of enabling tendencies in the relationship.  He resourced his spirituality where his commitment to his marriage is concerned, and reflected on his marriage hx and times of connection and conflict.  Counselor actively listened and affirmed patient, and helped patient strategize regarding approach to behavior patterns of concern per spouse.  Counselor and patient discussed patient treatment plan and patient gave his consent.  Interventions: Solution-Oriented/Positive Psychology, Humanistic/Existential, and Insight-Oriented  Diagnosis:   ICD-10-CM   1. Adjustment disorder  with anxiety  F43.22       Plan: Patient is scheduled for follow-up; continue process work and developing coping skills.  Patient identified short-term goal of pulling back from enabling tendencies, and increasing boundaries of his supportive role for spouse in order to encourage intrinsic motivation, and strengthen his own self-care.  Progress note was dictated with Dragon and reviewed for accuracy.  Almarie ONEIDA Sprang, [facility]

## 2023-09-25 NOTE — Telephone Encounter (Signed)
 LF 12/14, due 1/11

## 2023-09-25 NOTE — Telephone Encounter (Signed)
 [facility] [address] ok per Dr. Jennelle Human.

## 2023-09-26 ENCOUNTER — Telehealth: Payer: Self-pay | Admitting: Psychiatry

## 2023-09-26 MED ORDER — ZALEPLON 10 MG PO CAPS: 10.0000 mg | ORAL_CAPSULE | Freq: Every evening | ORAL | 1 refills | Status: DC | PRN

## 2023-09-26 NOTE — Telephone Encounter (Signed)
 Pt called and said that his pharmacy doesn't have the sonata in stock. [facility] cancel [facility] [address]

## 2023-10-03 ENCOUNTER — Encounter: Payer: Self-pay | Admitting: Professional Counselor

## 2023-10-03 ENCOUNTER — Ambulatory Visit: Payer: 59 | Admitting: Professional Counselor

## 2023-10-03 DIAGNOSIS — F4322 Adjustment disorder with anxiety: Secondary | ICD-10-CM

## 2023-10-03 NOTE — Progress Notes (Signed)
      [facility] Counselor/Therapist Progress Note  Patient ID: Maurice Gomez, MRN: [id number],    Date: 10/03/2023  Time Spent: 9:24 AM to 10:13 AM  Treatment Type: Individual Therapy  Reported Symptoms: Worries, preoccupying thoughts, stress, distractibility, restlessness, interpersonal concerns, caregiver strain  Mental Status Exam:  Appearance:   Neat     Behavior:  Appropriate, Sharing, and Motivated  Motor:  Normal  Speech/Language:   Clear and Coherent and Normal Rate  Affect:  Appropriate and Congruent  Mood:  normal  Thought process:  normal  Thought content:    WNL  Sensory/Perceptual disturbances:    WNL  Orientation:  oriented to person, place, time/date, and situation  Attention:  Good  Concentration:  Good  Memory:  WNL  Fund of knowledge:   Good  Insight:    Good  Judgment:   Good  Impulse Control:  Good   Risk Assessment: Danger to Self:  No Self-injurious Behavior: No Danger to Others: No Duty to Warn:no Physical Aggression / Violence:No  Access to Firearms a concern: No  Gang Involvement:No   Subjective: Patient presented to session to address concerns of anxiety.  He reported mixed progress at this time.  Patient reported progress in continuing tai chi and finding that to be helpful to symptomology, attending [employer] support group as helpful to his conceptualization of family member addiction challenges, and increased responsibility at work helping to keep him busy.  Counselor affirmed and reinforced patient proactive efforts.  Patient processed experience of ongoing stress and strain with his spouse and her concerns, and processed his experience of the history of their relationship, and sense of grief and loss.  Counselor held space, actively listened, helped to instill hope, and to encourage continued self-care, and strategies to empower others in their self responsibility.  Interventions: Solution-Oriented/Positive Psychology,  Humanistic/Existential, and Insight-Oriented  Diagnosis:   ICD-10-CM   1. Adjustment disorder with anxiety  F43.22       Plan: Patient is scheduled for follow-up; continue process work and developing coping skills.  Patient short-term goal between sessions to continue support practices both external in community, and by virtue of internal resourcing such as boundary implementation and prioritizing of self care.  Progress note was dictated with Dragon and reviewed for accuracy.  Gaspar Bidding, [facility]

## 2023-10-09 ENCOUNTER — Ambulatory Visit: Payer: 59 | Admitting: Professional Counselor

## 2023-10-09 ENCOUNTER — Encounter: Payer: Self-pay | Admitting: Professional Counselor

## 2023-10-09 DIAGNOSIS — F4322 Adjustment disorder with anxiety: Secondary | ICD-10-CM

## 2023-10-09 NOTE — Progress Notes (Signed)
      [facility] Counselor/Therapist Progress Note  Patient ID: Maurice Gomez, MRN: [id number],    Date: 10/09/2023  Time Spent: 9:10 AM to 10:10 AM  Treatment Type: Individual Therapy  Reported Symptoms: Worries, stress, interpersonal concerns  Mental Status Exam:  Appearance:   Neat     Behavior:  Appropriate and Sharing  Motor:  Normal  Speech/Language:   Clear and Coherent and Normal Rate  Affect:  Appropriate and Congruent  Mood:  normal  Thought process:  normal  Thought content:    WNL  Sensory/Perceptual disturbances:    WNL  Orientation:  oriented to person, place, time/date, and situation  Attention:  Good  Concentration:  Good  Memory:  WNL  Fund of knowledge:   Good  Insight:    Good  Judgment:   Good  Impulse Control:  Good   Risk Assessment: Danger to Self:  No Self-injurious Behavior: No Danger to Others: No Duty to Warn:no Physical Aggression / Violence:No  Access to Firearms a concern: No  Gang Involvement:No   Subjective: Patient presented to session to address concerns of anxiety.  He reported mixed progress at this time.  Patient processed experience of conversations with adult children around his marriage and related to spouses mental health challenges at this time.  Patient voiced experience of guilt for ways he feels he may have overwhelmed wife during the course of long time marriage.  Counselor helped facilitate insight and utilized DBT dialectical thinking skills to help patient resource accepting and normalizing having conflicting feelings at the same time.  Patient expressed his tendency to limit elevated sense of experience and to maintain balanced expectations.  He processed experience of his upbringing and ways he worked throughout development to prove himself, and to give to others.  Counselor and patient discussed patient adaptability skills, and benefit to him across the lifespan.  Interventions: Solution-Oriented/Positive  Psychology, Humanistic/Existential, and Insight-Oriented, DBT  Diagnosis:   ICD-10-CM   1. Adjustment disorder with anxiety  F43.22      Plan: Patient is scheduled for follow-up; continue process work and developing coping skills.  Progress note was dictated with Dragon and reviewed for accuracy.  Gaspar Bidding, [facility]

## 2023-10-23 ENCOUNTER — Ambulatory Visit: Payer: 59 | Admitting: Professional Counselor

## 2023-10-23 ENCOUNTER — Encounter: Payer: Self-pay | Admitting: Professional Counselor

## 2023-10-23 DIAGNOSIS — F4322 Adjustment disorder with anxiety: Secondary | ICD-10-CM | POA: Diagnosis not present

## 2023-10-23 NOTE — Progress Notes (Signed)
      [facility] Counselor/Therapist Progress Note  Patient ID: Maurice Gomez, MRN: [id number],    Date: 10/23/2023  Time Spent: 9:14 AM to 10:13 AM  Treatment Type: Individual Therapy  Reported Symptoms: Worries, stress, preoccupying thoughts, interpersonal concerns  Mental Status Exam:  Appearance:   Neat     Behavior:  Appropriate, Sharing, and Motivated  Motor:  Normal  Speech/Language:   Clear and Coherent and Normal Rate  Affect:  Appropriate and Congruent  Mood:  normal  Thought process:  normal  Thought content:    WNL  Sensory/Perceptual disturbances:    WNL  Orientation:  oriented to person, place, time/date, and situation  Attention:  Good  Concentration:  Good  Memory:  WNL  Fund of knowledge:   Good  Insight:    Good  Judgment:   Good  Impulse Control:  Good   Risk Assessment: Danger to Self:  No Self-injurious Behavior: No Danger to Others: No Duty to Warn:no Physical Aggression / Violence:No  Access to Firearms a concern: No  Gang Involvement:No   Subjective: Patient presented to session to address concerns of anxiety.  Patient reported mixed progress.  Patient processed experience of challenges within marital relationship.  He reported some improvement in shared warmth between he and spouse.   Patient identified his heart center as his strength and that from which he leads, he also identified it as what can make him feel vulnerable.  He shared regarding a poem he had written as a child that he was very proud of, that a teacher then questioned that he had written himself, and the impactful experience of this to his self-esteem.  Patient identified human connection as the cornerstone of his sense of meaning next to his faith.  Counselor actively listened, affirmed patient feelings, experience inner strengths and self-awareness, helped facilitate insight, and provided psychoeducation regarding the window of tolerance and relaxation techniques and discussed  with patient.  Interventions: Solution-Oriented/Positive Psychology, Humanistic/Existential, and Insight-Oriented, Psychoeducation  Diagnosis:   ICD-10-CM   1. Adjustment disorder with anxiety  F43.22       Plan: Patient is scheduled for follow-up; continue process work and developing coping skills.  Patient goal between sessions to continue to implement boundaries and limit enabling tendencies, and resource self-care.  Progress note was dictated with Dragon and reviewed for accuracy.  Almarie ONEIDA Sprang, [facility]

## 2023-10-24 ENCOUNTER — Other Ambulatory Visit: Payer: Self-pay

## 2023-10-24 DIAGNOSIS — F902 Attention-deficit hyperactivity disorder, combined type: Secondary | ICD-10-CM

## 2023-10-24 MED ORDER — AMPHETAMINE-DEXTROAMPHETAMINE 15 MG PO TABS: 15.0000 mg | ORAL_TABLET | Freq: Two times a day (BID) | ORAL | 0 refills | Status: DC

## 2023-10-24 MED ORDER — AMPHETAMINE-DEXTROAMPHET ER 30 MG PO CP24: 30.0000 mg | ORAL_CAPSULE | Freq: Every day | ORAL | 0 refills | Status: DC

## 2023-10-29 ENCOUNTER — Encounter: Payer: Self-pay | Admitting: Professional Counselor

## 2023-10-29 ENCOUNTER — Ambulatory Visit: Payer: 59 | Admitting: Professional Counselor

## 2023-10-29 DIAGNOSIS — F4322 Adjustment disorder with anxiety: Secondary | ICD-10-CM | POA: Diagnosis not present

## 2023-10-29 NOTE — Progress Notes (Signed)
      [facility] Counselor/Therapist Progress Note  Patient ID: Maurice Gomez, MRN: [id number],    Date: 10/29/2023  Time Spent: 1:09 PM to 2:09 PM  Treatment Type: Individual Therapy  Reported Symptoms: Worries, stress, caregiver strain, interpersonal concerns  Mental Status Exam:  Appearance:   Neat     Behavior:  Appropriate, Sharing, and Motivated  Motor:  Normal  Speech/Language:   Clear and Coherent and Normal Rate  Affect:  Appropriate and Congruent  Mood:  normal  Thought process:  normal  Thought content:    WNL  Sensory/Perceptual disturbances:    WNL  Orientation:  oriented to person, place, time/date, and situation  Attention:  Good  Concentration:  Good  Memory:  WNL  Fund of knowledge:   Good  Insight:    Good  Judgment:   Good  Impulse Control:  Good   Risk Assessment: Danger to Self:  No Self-injurious Behavior: No Danger to Others: No Duty to Warn:no Physical Aggression / Violence:No  Access to Firearms a concern: No  Gang Involvement:No   Subjective: Patient presented to session to address concerns of anxiety.  He reported mixed progress at this time.  Patient processed experience of marital concerns and his wife's recovery trajectory and how it implicates him.  He processed the experience of family history and dynamics as relate her recovery also.  Counselor held space for patient processing, actively listened, affirmed patient feelings and experience, and helped facilitate insight.  Counselor provided psychoeducation regarding addiction, dependency, stages of change, harm reduction, and intrinsic and extrinsic motivation.  Counselor and patient discussed helpful strategies related to these themes.  Interventions: Solution-Oriented/Positive Psychology, Humanistic/Existential, and Insight-Oriented  Diagnosis:   ICD-10-CM   1. Adjustment disorder with anxiety  F43.22       Plan: Patient is scheduled for follow-up; continue process work and  developing coping skills.  Patient short-term goal between sessions to recede from input into wife's recovery to encourage intrinsic motivation.  Progress note was dictated with Dragon and reviewed for accuracy.  Gaspar Bidding, [facility]

## 2023-11-05 ENCOUNTER — Ambulatory Visit: Payer: 59 | Admitting: Professional Counselor

## 2023-11-05 ENCOUNTER — Encounter: Payer: Self-pay | Admitting: Professional Counselor

## 2023-11-05 DIAGNOSIS — F4322 Adjustment disorder with anxiety: Secondary | ICD-10-CM

## 2023-11-05 NOTE — Progress Notes (Signed)
      [facility] Counselor/Therapist Progress Note  Patient ID: Maurice Gomez, MRN: [id number],    Date: 11/05/2023  Time Spent: 11:09 AM - 12:12 PM   Treatment Type: Individual Therapy  Reported Symptoms: worries, restlessness, phase of life concerns, interpersonal concerns  Mental Status Exam:  Appearance:   Neat     Behavior:  Appropriate, Sharing, and Motivated  Motor:  Normal  Speech/Language:   Clear and Coherent and Normal Rate  Affect:  Appropriate and Congruent  Mood:  normal  Thought process:  normal  Thought content:    WNL  Sensory/Perceptual disturbances:    WNL  Orientation:  oriented to person, place, time/date, and situation  Attention:  Good  Concentration:  Good  Memory:  WNL  Fund of knowledge:   Good  Insight:    Good  Judgment:   Good  Impulse Control:  Good   Risk Assessment: Danger to Self:  No Self-injurious Behavior: No Danger to Others: No Duty to Warn:no Physical Aggression / Violence:No  Access to Firearms a concern: No  Gang Involvement:No   Subjective: Patient presented to session to address concerns of anxiety.  He reported progress.  He identified his wife's concerns as having improved, with her stage of change around precontemplation to have increased to more of a contemplation stage of change.  He voiced having hopefulness around her increased commitment to recovery process and recommended strategies from her treatment team.  Patient processed experience of a positive perspective around phase of life and aging.  Counselor and patient discussed patient perspective and circumstances of his life at this time at length.  Counselor actively listened, affirmed patient feelings and experience, and helped facilitate insight and develop strategies around patient practicing of non-outcome-oriented activities so as to nurture presence and being in the here and now mindfully.  Patient identified consideration of going hiking with his  grandchildren for quality time to experience hiking as a practice of "being" mindset as opposed to practice with a goal mindset such as with fishing.  Interventions: Solution-Oriented/Positive Psychology, Humanistic/Existential, and Insight-Oriented  Diagnosis:   ICD-10-CM   1. Adjustment disorder with anxiety  F43.22       Plan: Patient is scheduled for follow-up; continue process work and developing coping skills.  Patient personal goal between sessions to lean into non-outcome-oriented practice of mindfulness.  Progress note was dictated with Dragon and reviewed for accuracy.  Gaspar Bidding, [facility]

## 2023-11-12 ENCOUNTER — Encounter: Payer: Self-pay | Admitting: Professional Counselor

## 2023-11-12 ENCOUNTER — Ambulatory Visit: Payer: 59 | Admitting: Professional Counselor

## 2023-11-12 DIAGNOSIS — F4322 Adjustment disorder with anxiety: Secondary | ICD-10-CM | POA: Diagnosis not present

## 2023-11-12 NOTE — Progress Notes (Signed)
      [facility] Counselor/Therapist Progress Note  Patient ID: Maurice Gomez, MRN: [id number],    Date: 11/12/2023  Time Spent: 9:13 AM to 10:15 AM  Treatment Type: Individual Therapy  Reported Symptoms: Worries, stress, interpersonal concerns, phase of life concerns  Mental Status Exam:  Appearance:   Neat     Behavior:  Appropriate, Sharing, and Motivated  Motor:  Normal  Speech/Language:   Clear and Coherent and Normal Rate  Affect:  Appropriate and Congruent  Mood:  normal  Thought process:  normal  Thought content:    WNL  Sensory/Perceptual disturbances:    WNL  Orientation:  oriented to person, place, time/date, and situation  Attention:  Good  Concentration:  Good  Memory:  WNL  Fund of knowledge:   Good  Insight:    Good  Judgment:   Good  Impulse Control:  Good   Risk Assessment: Danger to Self:  No Self-injurious Behavior: No Danger to Others: No Duty to Warn:no Physical Aggression / Violence:No  Access to Firearms a concern: No  Gang Involvement:No   Subjective: Patient presented to session to address concerns of anxiety.  He reported progress.  He processed experience of unexpected conflict and resulting internal emotional dysregulation which he as able to stabilize before any escalation interpersonally.  He cited having resourced a prior session's conversation regarding the window of tolerance and helpfulness of keeping one's frontal lobe engaged; counselor affirmed patient resourcing of coping skills.  Counselor and patient discussed what triggered patient to be upset in the circumstance, and patient identified his values.  He processed experience of relational connection in his life by history, and importance of this value to him, and that of having a growth mindset.  He reflected on his value formation over time, and his relationship with his parents and how they influenced and impacted him.  Counselor provided psychoeducation on theory of effective  dependence, and she and patient discussed resonance with patient's concerns.  Interventions: Solution-Oriented/Positive Psychology, Humanistic/Existential, and Insight-Oriented  Diagnosis:   ICD-10-CM   1. Adjustment disorder with anxiety  F43.22       Plan: Patient is scheduled for follow-up; continue process work and developing coping skills.  Patient to continue to resource coping skills, and reflect on relationship with parents and impacting his development between sessions.  Progress note was dictated with Dragon and reviewed for accuracy.  Gaspar Bidding, [facility]

## 2023-11-17 ENCOUNTER — Other Ambulatory Visit: Payer: Self-pay | Admitting: Psychiatry

## 2023-11-20 ENCOUNTER — Ambulatory Visit: Payer: 59 | Admitting: Professional Counselor

## 2023-11-20 ENCOUNTER — Encounter: Payer: Self-pay | Admitting: Professional Counselor

## 2023-11-20 DIAGNOSIS — F4322 Adjustment disorder with anxiety: Secondary | ICD-10-CM

## 2023-11-20 NOTE — Progress Notes (Signed)
      [facility] Counselor/Therapist Progress Note  Patient ID: BRENEN BEIGEL, MRN: [id number],    Date: 11/20/2023  Time Spent: 2:22 PM to 3:07 PM  Treatment Type: Individual Therapy  Reported Symptoms: Worries, stress, interpersonal concerns  Mental Status Exam:  Appearance:   Neat     Behavior:  Appropriate, Sharing, and Motivated  Motor:  Normal  Speech/Language:   Clear and Coherent and Normal Rate  Affect:  Appropriate and Congruent  Mood:  normal  Thought process:  normal  Thought content:    WNL  Sensory/Perceptual disturbances:    WNL  Orientation:  oriented to person, place, time/date, and situation  Attention:  Good  Concentration:  Good  Memory:  WNL  Fund of knowledge:   Good  Insight:    Good  Judgment:   Good  Impulse Control:  Good   Risk Assessment: Danger to Self:  No Self-injurious Behavior: No Danger to Others: No Duty to Warn:no Physical Aggression / Violence:No  Access to Firearms a concern: No  Gang Involvement:No   Subjective: Patient presented to session to address concerns of anxiety.  He reported mixed progress at this time.  Patient processed the experience of his wife's recovery on himself and his children, and explored impact on children especially, including over time when wife was in active addiction.  He also processed sense that she was having a difficult time with recovery, and that he worries about the outcome.  Patient reflected on how he has tried to live life in such a way as to accommodate wife's needs and preferences, and is hopeful for greater balance.  Counselor actively listened, affirmed patient feelings and experience, helped facilitate insight and develop strategies into patient marital concerns including as a recovery support person to his wife.  Interventions: Solution-Oriented/Positive Psychology, Humanistic/Existential, and Insight-Oriented  Diagnosis:   ICD-10-CM   1. Adjustment disorder with anxiety  F43.22        Plan: Patient is scheduled for follow-up; continue process work and developing coping skills.  Patient short-term goal between sessions to continue to place boundaries and to step back from over functioning and enabling tendencies.  Progress note was dictated with Dragon and reviewed for accuracy.  Gaspar Bidding, [facility]

## 2023-11-28 ENCOUNTER — Ambulatory Visit: Payer: 59 | Admitting: Professional Counselor

## 2023-11-28 ENCOUNTER — Ambulatory Visit: Admitting: Professional Counselor

## 2023-12-02 ENCOUNTER — Other Ambulatory Visit: Payer: Self-pay

## 2023-12-02 ENCOUNTER — Telehealth: Payer: Self-pay | Admitting: Psychiatry

## 2023-12-02 DIAGNOSIS — F902 Attention-deficit hyperactivity disorder, combined type: Secondary | ICD-10-CM

## 2023-12-02 MED ORDER — AMPHETAMINE-DEXTROAMPHET ER 30 MG PO CP24
30.0000 mg | ORAL_CAPSULE | Freq: Every day | ORAL | 0 refills | Status: AC
Start: 2023-12-30 — End: ?

## 2023-12-02 MED ORDER — AMPHETAMINE-DEXTROAMPHETAMINE 15 MG PO TABS: 15.0000 mg | ORAL_TABLET | Freq: Two times a day (BID) | ORAL | 0 refills | Status: DC

## 2023-12-02 MED ORDER — AMPHETAMINE-DEXTROAMPHETAMINE 15 MG PO TABS
15.0000 mg | ORAL_TABLET | Freq: Two times a day (BID) | ORAL | 0 refills | Status: AC
Start: 2023-12-02 — End: ?

## 2023-12-02 MED ORDER — AMPHETAMINE-DEXTROAMPHET ER 30 MG PO CP24
30.0000 mg | ORAL_CAPSULE | Freq: Every day | ORAL | 0 refills | Status: AC
Start: 2023-12-02 — End: ?

## 2023-12-02 MED ORDER — AMPHETAMINE-DEXTROAMPHET ER 30 MG PO CP24
30.0000 mg | ORAL_CAPSULE | Freq: Every day | ORAL | 0 refills | Status: AC
Start: 2024-01-27 — End: ?

## 2023-12-02 NOTE — Telephone Encounter (Signed)
 Pt LVM @ 7:06p on 3/17 requesting refill of Adderall XR 30mg  and Adderall 15mg  to   [id number] [address] [address], [place] - [address], [place] [address] Phone: [phone]  Fax: [phone]   Next appt 6/16 and added to the wait list

## 2023-12-02 NOTE — Telephone Encounter (Signed)
 Pended both [facility]der[facility] doses to [facility] [address]

## 2023-12-12 ENCOUNTER — Ambulatory Visit: Payer: 59 | Admitting: Professional Counselor

## 2023-12-12 NOTE — Progress Notes (Signed)
 Patient did not show for scheduled appointment. No message.  Bartolo Darter, [facility]

## 2023-12-17 ENCOUNTER — Ambulatory Visit: Admitting: Professional Counselor

## 2023-12-23 ENCOUNTER — Encounter: Payer: Self-pay | Admitting: Professional Counselor

## 2023-12-23 ENCOUNTER — Ambulatory Visit: Payer: 59 | Admitting: Professional Counselor

## 2023-12-23 DIAGNOSIS — F4322 Adjustment disorder with anxiety: Secondary | ICD-10-CM

## 2023-12-23 NOTE — Progress Notes (Signed)
      [facility] Counselor/Therapist Progress Note  Patient ID: Maurice Gomez, MRN: [id number],    Date: 12/23/2023  Time Spent: 9:11 AM to 10:10 AM  Treatment Type: Individual Therapy  Reported Symptoms: Fatigue, tearfulness, caregiver strain, worries, sadness  Mental Status Exam:  Appearance:   Neat     Behavior:  Appropriate, Sharing, and Motivated  Motor:  Normal  Speech/Language:   Clear and Coherent and Normal Rate  Affect:  Appropriate and Congruent  Mood:  intermittent sadness  Thought process:  normal  Thought content:    WNL  Sensory/Perceptual disturbances:    WNL  Orientation:  oriented to person, place, time/date, and situation  Attention:  Good  Concentration:  Good  Memory:  WNL  Fund of knowledge:   Good  Insight:    Good  Judgment:   Good  Impulse Control:  Good   Risk Assessment: Danger to Self:  No Self-injurious Behavior: No Danger to Others: No Duty to Warn:no Physical Aggression / Violence:No  Access to Firearms a concern: No  Gang Involvement:No   Subjective: Patient presented to session to address concerns of anxiety, with some additional symptoms of sadness.  Patient reported mixed progress at this time.  He reported concerns for his spouse to continue, and experience of tearfulness as relates to triggering of emotions resonating with him via external content.  He processed the experience of resulting self reflections and sense of catharsis to be intent on preserving the person that he is in the face of his wife's personal and their interpersonal challenges at this time.  Counselor affirmed patient feelings and experience, and helped to facilitate insight, reinforce patient sense of hope and vitality.  Patient also processed need to say no out of self preservation, and counselor assisted with strategies for approaching conversation of transparency around this issue.  Counselor help facilitate insight around patient intention to not be denying  others, but to be giving to himself in fostering protective space (and likewise empowering others to be self responsible).  Patient and counselor discussed marital dynamics and withdrawer and pursuer patterns, and potential for "meeting in the middle" to have both partners needs met.  Interventions: Assertiveness/Communication, Solution-Oriented/Positive Psychology, Humanistic/Existential, and Insight-Oriented  Diagnosis:   ICD-10-CM   1. Adjustment disorder with anxiety  F43.22       Plan: Patient is scheduled for follow-up; continue process work and developing coping skills.  Patient short-term goal between sessions to continue to implement boundaries in caregiving role, and prioritize his self-care objectives including positive self talk and support system resourcing.  Progress note was dictated with Dragon and reviewed for accuracy.  Maurice Gomez, [facility]

## 2024-01-08 ENCOUNTER — Ambulatory Visit: Payer: 59 | Admitting: Professional Counselor

## 2024-01-22 ENCOUNTER — Ambulatory Visit (INDEPENDENT_AMBULATORY_CARE_PROVIDER_SITE_OTHER): Payer: 59 | Admitting: Professional Counselor

## 2024-01-22 DIAGNOSIS — Z0389 Encounter for observation for other suspected diseases and conditions ruled out: Secondary | ICD-10-CM

## 2024-01-22 NOTE — Progress Notes (Signed)
 Patient did not show for scheduled appointment. No message.  Bartolo Darter, [facility]

## 2024-02-05 ENCOUNTER — Ambulatory Visit: Admitting: Professional Counselor

## 2024-02-05 ENCOUNTER — Encounter: Payer: Self-pay | Admitting: Professional Counselor

## 2024-02-05 DIAGNOSIS — F4322 Adjustment disorder with anxiety: Secondary | ICD-10-CM

## 2024-02-05 NOTE — Progress Notes (Signed)
 That should be in this book!       [facility] Counselor/Therapist Progress Note  Patient ID: Maurice Gomez, MRN: [id number],    Date: 02/05/2024  Time Spent: 2:09 PM to 3:12 PM  Treatment Type: Individual Therapy  Reported Symptoms: Worries, restlessness, stress, caregiving strain, interpersonal concerns  Mental Status Exam:  Appearance:   Neat     Behavior:  Appropriate, Sharing, and Motivated  Motor:  Normal  Speech/Language:   Clear and Coherent and Normal Rate  Affect:  Appropriate and Congruent  Mood:  normal  Thought process:  normal  Thought content:    WNL  Sensory/Perceptual disturbances:    WNL  Orientation:  oriented to person, place, time/date, and situation  Attention:  Good  Concentration:  Good  Memory:  WNL  Fund of knowledge:   Good  Insight:    Good  Judgment:   Good  Impulse Control:  Good   Risk Assessment: Danger to Self:  No Self-injurious Behavior: No Danger to Others: No Duty to Warn:no Physical Aggression / Violence:No  Access to Firearms a concern: No  Gang Involvement:No   Subjective: Patient presented to session to address concerns of anxiety.  He reported mixed progress at this time.  He reported progress in working to implement boundaries in interpersonal relationship of concern, with increased sense of acceptance, and some necessary emotional distancing, alongside practical boundaries as well.  Patient identified his spouse to be in the hospital at this time, and for circumstance to be worrisome and stressful, and for him to be experiencing concern about her return home, regarding her overall wellness and meeting her needs.  Counselor actively listened, affirmed patient feelings and experience, reinforced patient practicing of boundaries and protective factors, encouraged patient self-care and support system out reach.  Interventions: Solution-Oriented/Positive Psychology, Humanistic/Existential, and Insight-Oriented  Diagnosis:    ICD-10-CM   1. Adjustment disorder with anxiety  F43.22       Plan: Patient is scheduled for follow-up; continue process work and developing coping skills.  STG between sessions for patient to continue to resource boundary strategies interpersonally, and to prioritize self-care including adequate rest, relaxation, leaning on support system.  Progress note was dictated with Dragon and reviewed for accuracy.  Almarie ONEIDA Sprang, [facility]

## 2024-02-18 ENCOUNTER — Other Ambulatory Visit (HOSPITAL_BASED_OUTPATIENT_CLINIC_OR_DEPARTMENT_OTHER): Payer: Self-pay | Admitting: Internal Medicine

## 2024-02-18 DIAGNOSIS — E782 Mixed hyperlipidemia: Secondary | ICD-10-CM

## 2024-02-26 ENCOUNTER — Ambulatory Visit: Admitting: Professional Counselor

## 2024-02-26 DIAGNOSIS — F4322 Adjustment disorder with anxiety: Secondary | ICD-10-CM

## 2024-02-26 NOTE — Progress Notes (Signed)
      [facility] Counselor/Therapist Progress Note  Patient ID: Maurice Gomez, MRN: [id number],    Date: 02/26/2024  Time Spent: 10:13 AM - 11:15 AM   Treatment Type: Individual Therapy  Reported Symptoms: stress, worries, restlessness, interpersonal concerns, caregiver strain  Mental Status Exam:  Appearance:   Neat     Behavior:  Appropriate, Sharing, and Motivated  Motor:  Normal  Speech/Language:   Clear and Coherent and Normal Rate  Affect:  Appropriate and Congruent  Mood:  normal  Thought process:  normal  Thought content:    WNL  Sensory/Perceptual disturbances:    WNL  Orientation:  oriented to person, place, time/date, and situation  Attention:  Good  Concentration:  Good  Memory:  WNL  Fund of knowledge:   Good  Insight:    Good  Judgment:   Good  Impulse Control:  Good   Risk Assessment: Danger to Self:  No Self-injurious Behavior: No Danger to Others: No Duty to Warn:no Physical Aggression / Violence:No  Access to Firearms a concern: No  Gang Involvement:No   Subjective: Patient presented to session to address concerns of anxiety.  He reported mixed progress at this time.  He reported experiencing exacerbated work stress and caregiver strain.  He processed experience of phase of life concerns as relates circumstances in his life.  He reported working to practice living in the present, and identified desires regarding optimal living including leaning more into his lifestyle passions such as fishing.  Counselor actively listened, affirmed patient feelings and experience and helped to facilitate insight into how patient might rely more on others including allowing others to take more responsibility for their needs.  Counselor encouraged patient ongoing self-care including as relates prioritizing pleasurable activities.  Interventions: Humanistic/Existential and Insight-Oriented  Diagnosis:   ICD-10-CM   1. Adjustment disorder with anxiety  F43.22        Plan: Pt is scheduled for a follow-up; continue process work and developing coping skills. STG between sessions to continue to resource downtime including hobbies (fishing), get adequate sleep, practice boundary maintinence interpersonally.  Almarie ONEIDA Sprang, [facility]

## 2024-03-01 ENCOUNTER — Ambulatory Visit: Admitting: Psychiatry

## 2024-03-01 ENCOUNTER — Encounter: Payer: Self-pay | Admitting: Psychiatry

## 2024-03-01 DIAGNOSIS — F902 Attention-deficit hyperactivity disorder, combined type: Secondary | ICD-10-CM | POA: Diagnosis not present

## 2024-03-01 DIAGNOSIS — F5105 Insomnia due to other mental disorder: Secondary | ICD-10-CM | POA: Diagnosis not present

## 2024-03-01 MED ORDER — ZALEPLON 10 MG PO CAPS: 10.0000 mg | ORAL_CAPSULE | Freq: Every evening | ORAL | 3 refills | Status: DC | PRN

## 2024-03-01 MED ORDER — AMPHETAMINE-DEXTROAMPHETAMINE 15 MG PO TABS: 15.0000 mg | ORAL_TABLET | Freq: Two times a day (BID) | ORAL | 0 refills | Status: DC

## 2024-03-01 MED ORDER — AMPHETAMINE-DEXTROAMPHET ER 30 MG PO CP24: 30.0000 mg | ORAL_CAPSULE | Freq: Every day | ORAL | 0 refills | Status: DC

## 2024-03-01 NOTE — Progress Notes (Unsigned)
 Maurice Gomez [id number] Mar 05, 1956 68 y.o.    Subjective:   Patient ID:  Maurice Gomez is a 68 y.o. (DOB 1956-02-04) male.  Chief Complaint:  Chief Complaint  Patient presents with  . Follow-up  . ADD  . Sleeping Problem    HPI [name] presents to the office today for follow-up of ADD.    Increased Vyvanse  to 60 mg 2019.  seen March 04, 2019.  No meds were changed.  He continued Vyvanse  60 mg daily.  Oral days when he forgot Vyvanse  he would take Adderall. Also variety of tx for sleep have been used.  02/2020 appt noted: Working long hours.  Busy but OK.  [name] still sleeps until noon and has had some heart issues.  She's had MI.   Sleep is better but not great.   4 to 4& 1/2 hours sleep and not tired. Vyvanse  only lasts about 6 hours now.  Problems with sleep.  No problems going. Awaken after 4 hours and difficult to go back to sleep.  Gettting worse and going on for a year.  Misses the lack of sleep. Temazepam inconsistently keeps him asleep and doesn't want to take it all the time DT feeling drugged.  Doesn't believe related to stimulant bc independent of vyvanse  use.  Never comfortable when he sleeps. Some pain issues, laying.  Tried new mattress.  Tried sonata  at onset but awoke.  Also has to get up to urinate. Plan: Try Mydayis  50 bc inadequate duration from Vyvanse . Otherwise using Adderall in Am and Vyvanse  60 at noon.  10/23/2020 appointment with the following noted: Mydayis  never tried.   Been a little worried about weight and BP.  BP got elevated with weight gain. BP usually 140-150/85-95. Dx OSA AHI 36 and can't get CPAP for 3 mos DT chip shortage.  Loud snoring and nonrestorative sleep and fatigue ADD would like longer duration. Patient reports stable mood and denies depressed or irritable moods.  Patient denies any recent difficulty with anxiety. Denies appetite disturbance.  Patient reports that energy and motivation have been good.     Patient denies any suicidal ideation. Plan: Try Mydayis  50 bc inadequate duration from Vyvanse . Otherwise using Adderall in Am and Vyvanse  60 at noon.  04/24/2021 appointment with the following noted: SP Covid this summer.  Still testing positive.  Still some loss of taste and smell and slight cough.   [name] and [name] never tested positive or got sick. Also had period of back problem. Likes Mydayis  with better duration but still not 16 hours. 50 mg was too much. Still needs Adderall bc if forgets Mydayis  can take it later. Vyvanse  super effective but crashes midday.  Mydayis  less effective without crash and works. Change BP meds and it's managed. Plan: Mydayis  37.5 mg daily bc inadequate duration from Vyvanse .  10/24/2021 appointment with the following noted: Likes Mydayis  duration better but not.  Felt the amphetamine  a bit more with Vyvanse .   Sleep is not as good as should be.  Struggle with CPAP.  Using nasal pillows.  Was off for 2 mos.  Restarted 3 weeks ago.  It leaks.   Sleeps probably 4 hours and sometimes less.  Not comfortable in bed.  Restored initially when wakes and then gets tired. Patient reports stable mood and denies depressed or irritable moods.  Patient denies any recent difficulty with anxiety.  Denies appetite disturbance.  Patient reports that energy and motivation have been good.   Patient denies any  suicidal ideation.  02/19/22 appt noted: On Mydayis  37.5 mg daily or Adderall. No SE Focus drifting a little in a way it has not in years.  Took D's vyvanse  60 and it seemed better. Takes Adderall if forgets Mydayis . Not using CPAP.   Plan: Increase Mydayis  50 mg daily bc inadequate duration from Vyvanse  and need for better focus  08/06/22 appt noted: On Mydayis  50 mg AM or Adderall 15 TID.   Better after increase and tolerates it well.   Sonata  gives 4 hours.  Not sleepy in the day.   No new complaints. No changes  02/04/23 appt noted: Meds: lately Adderall Xr 30 AM,  and some Adderall 15 mg typically BID- TID in place of Mydayis . Sleep better with Adderall vs Mydayis  Working . Meds Zaleplon  for EMA. Continue meds and satisfied. No SE.   03/01/24 appt noted:  Med: Adderall XR 30 AM and usually both bc duration Adderall 15 mg TID prn, Sonata  10 HS Pleased with it.  No complaints re: stimulant.  No SE usually except prostate issues with some initial U hesitancy. Using 1/4 finasteride and Flomax and it is ok.   Just in for uro check for prostate but it is stable with PSA. Errs on side of not taking Sonata .  Prior sleep meds tried include:  Ambien  for a week which worked but nervous over the withdrawal effects he had in the past.   Restoril 30, Xanax, Diazepam, trazodone  hangover,  Sonata   Mirtazapine  7.5 severe hangover  Other previous psychiatric medications include include Ritalin, Concerta 36, Ritalin SR, Daytrana with skin irritation, Vyvanse  60, Adderall, Mydayis  never tried, Strattera  Review of Systems:  Review of Systems  Constitutional:  Negative for fatigue.  Cardiovascular:  Negative for palpitations.  Genitourinary:  Negative for difficulty urinating.  Musculoskeletal:  Positive for arthralgias, back pain and joint swelling. Negative for neck pain.  Neurological:  Negative for tremors and weakness.  Psychiatric/Behavioral:  Negative for agitation, behavioral problems, confusion, decreased concentration, dysphoric mood, hallucinations, self-injury, sleep disturbance and suicidal ideas. The patient is not nervous/anxious and is not hyperactive.     Medications: Prior to Admission: (Not in a hospital admission)   Current Outpatient Medications  Medication Sig Dispense Refill  . levothyroxine (SYNTHROID) 112 MCG tablet Take 112 mcg by mouth every morning.    . losartan -hydrochlorothiazide (HYZAAR) 50-12.5 MG tablet Take 1 tablet by mouth daily.    . pantoprazole (PROTONIX) 20 MG tablet Take 20 mg by mouth daily.    . rosuvastatin  (CRESTOR) 5 MG tablet Take 5 mg by mouth daily.    . tadalafil  (CIALIS ) 10 MG tablet TAKE ONE TABLET BY MOUTH DAILY AS NEEDED FOR ERECTILE DYSFUNCTION 90 tablet 0  . Tamsulosin HCl (FLOMAX) 0.4 MG CAPS Take by mouth at bedtime.    . amphetamine -dextroamphetamine  (ADDERALL XR) 30 MG 24 hr capsule Take 1 capsule (30 mg total) by mouth daily. 30 capsule 0  . [START ON 03/29/2024] amphetamine -dextroamphetamine  (ADDERALL XR) 30 MG 24 hr capsule Take 1 capsule (30 mg total) by mouth daily. 30 capsule 0  . [START ON 04/26/2024] amphetamine -dextroamphetamine  (ADDERALL XR) 30 MG 24 hr capsule Take 1 capsule (30 mg total) by mouth daily. 30 capsule 0  . amphetamine -dextroamphetamine  (ADDERALL) 15 MG tablet Take 1 tablet by mouth 2 (two) times daily. 60 tablet 0  . [START ON 03/29/2024] amphetamine -dextroamphetamine  (ADDERALL) 15 MG tablet Take 1 tablet by mouth 2 (two) times daily. 60 tablet 0  . [START ON 04/26/2024] amphetamine -dextroamphetamine  (  ADDERALL) 15 MG tablet Take 1 tablet by mouth 2 (two) times daily. 60 tablet 0  . zaleplon  (SONATA ) 10 MG capsule Take 1 capsule (10 mg total) by mouth at bedtime as needed for sleep. 30 capsule 3   No current facility-administered medications for this visit.    Medication Side Effects: None  Allergies:  Allergies  Allergen Reactions  . Morphine And Codeine Itching    Past Medical History:  Diagnosis Date  . 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 Heartburn   . Hypercholesterolemia   . Microscopic hematuria 2009  . Polyp of colon   . Prostate cancer (HCC) 07/27/12   gleason 3+3=6, vol37 ml  . Pulmonary embolism (HCC)    hx  . Thyroid  disease    hypothyroid    Family History  Problem Relation Age of Onset  . Cancer Brother        prostate, completed radiaiton tx 1 year ago  . Colon cancer Neg Hx     Social History   Socioeconomic History  . Marital status: Married    Spouse name: Not on file  . Number of children: Not on file   . Years of education: Not on file  . Highest education level: Not on file  Occupational History  . Not on file  Tobacco Use  . Smoking status: Former    Current packs/day: 0.00    Types: Cigarettes    Quit date: 08/18/1987    Years since quitting: 36.5  . Smokeless tobacco: Never  Substance and Sexual Activity  . Alcohol use: No    Alcohol/week: 0.0 standard drinks of alcohol  . Drug use: No  . Sexual activity: Not on file  Other Topics Concern  . Not on file  Social History Narrative  . Not on file   Social Drivers of Health   Financial Resource Strain: Not on file  Food Insecurity: Not on file  Transportation Needs: Not on file  Physical Activity: Not on file  Stress: Not on file  Social Connections: Not on file  Intimate Partner Violence: Not on file    Past Medical History, Surgical history, Social history, and Family history were reviewed and updated as appropriate.   Please see review of systems for further details on the patient's review from today.   Objective:   Physical Exam:  There were no vitals taken for this visit.  Physical Exam Constitutional:      General: He is not in acute distress.    Appearance: He is obese.   Musculoskeletal:        General: No deformity.   Neurological:     Mental Status: He is alert and oriented to person, place, and time.     Coordination: Coordination normal.     Gait: Gait normal.   Psychiatric:        Attention and Perception: Attention normal.        Mood and Affect: Mood is not anxious or depressed. Affect is not inappropriate.        Speech: Speech normal.        Behavior: Behavior normal. Behavior is not hyperactive.        Thought Content: Thought content normal. Thought content is not delusional. Thought content does not include homicidal or suicidal ideation. Thought content does not include suicidal plan.        Cognition and Memory: Cognition normal.        Judgment: Judgment normal.  Comments:  Insight intact. No auditory or visual hallucinations. No delusions.     Lab Review:  No results found for: NA, K, CL, CO2, GLUCOSE, BUN, CREATININE, CALCIUM, PROT, ALBUMIN, AST, ALT, ALKPHOS, BILITOT, GFRNONAA, GFRAA  No results found for: WBC, RBC, HGB, HCT, PLT, MCV, MCH, MCHC, RDW, LYMPHSABS, MONOABS, EOSABS, BASOSABS  No results found for: POCLITH, LITHIUM   No results found for: PHENYTOIN, PHENOBARB, VALPROATE, CBMZ   .res Assessment: Plan:    Traci Ric was seen today for follow-up, add and sleeping problem.  Diagnoses and all orders for this visit:  Attention deficit hyperactivity disorder (ADHD), combined type -     amphetamine -dextroamphetamine  (ADDERALL XR) 30 MG 24 hr capsule; Take 1 capsule (30 mg total) by mouth daily. -     amphetamine -dextroamphetamine  (ADDERALL) 15 MG tablet; Take 1 tablet by mouth 2 (two) times daily. -     amphetamine -dextroamphetamine  (ADDERALL XR) 30 MG 24 hr capsule; Take 1 capsule (30 mg total) by mouth daily. -     amphetamine -dextroamphetamine  (ADDERALL) 15 MG tablet; Take 1 tablet by mouth 2 (two) times daily. -     amphetamine -dextroamphetamine  (ADDERALL XR) 30 MG 24 hr capsule; Take 1 capsule (30 mg total) by mouth daily. -     amphetamine -dextroamphetamine  (ADDERALL) 15 MG tablet; Take 1 tablet by mouth 2 (two) times daily.  Insomnia due to mental condition -     zaleplon  (SONATA ) 10 MG capsule; Take 1 capsule (10 mg total) by mouth at bedtime as needed for sleep.   Reviewed stimulant types and options.  Options gabapentin, Intermezzo , trazodone , mirtazapine , sonata , amitriptyline, Seroquel and disc options and SE each  Sleep is better.  Sleep is better with EMA.  prn Sonata  5-10 helps  Discussed potential benefits, risks, and side effects of stimulants with patient to include increased heart rate, palpitations, insomnia, increased anxiety, increased  irritability, or decreased appetite.  Instructed patient to contact office if experiencing any significant tolerability issues.  Continue Adderall XR 30 AM and Adderall 15 BID-TID  6 mos  Nori Beat, MD, DFAPA   Please see After Visit Summary for patient specific instructions.  Future Appointments  Date Time Provider Department Center  03/08/2024  3:00 PM Anthon Kins [facility] CP-CP None  03/16/2024  7:30 AM [facility] 1 [facility]  04/16/2024  9:00 AM Anthon Kins, [facility] CP-CP None  05/05/2024 10:00 AM Anthon Kins, [facility] CP-CP None  05/21/2024 10:00 AM Anthon Kins, [facility] CP-CP None       No orders of the defined types were placed in this encounter.      -------------------------------

## 2024-03-08 ENCOUNTER — Ambulatory Visit (INDEPENDENT_AMBULATORY_CARE_PROVIDER_SITE_OTHER): Payer: Self-pay | Admitting: Professional Counselor

## 2024-03-08 DIAGNOSIS — Z0389 Encounter for observation for other suspected diseases and conditions ruled out: Secondary | ICD-10-CM

## 2024-03-08 NOTE — Progress Notes (Signed)
 Pt did not show for scheduled appointment, No message.   Maurice Gomez, [facility]

## 2024-03-16 ENCOUNTER — Ambulatory Visit (HOSPITAL_BASED_OUTPATIENT_CLINIC_OR_DEPARTMENT_OTHER)
Admission: RE | Admit: 2024-03-16 | Discharge: 2024-03-16 | Disposition: A | Discharge status: Home / Self Care | Payer: Self-pay | Source: Ambulatory Visit | Attending: Internal Medicine | Admitting: Internal Medicine

## 2024-03-16 DIAGNOSIS — E782 Mixed hyperlipidemia: Secondary | ICD-10-CM | POA: Insufficient documentation

## 2024-04-13 ENCOUNTER — Ambulatory Visit (INDEPENDENT_AMBULATORY_CARE_PROVIDER_SITE_OTHER): Admitting: Professional Counselor

## 2024-04-16 ENCOUNTER — Ambulatory Visit (INDEPENDENT_AMBULATORY_CARE_PROVIDER_SITE_OTHER): Admitting: Professional Counselor

## 2024-04-16 ENCOUNTER — Encounter: Payer: Self-pay | Admitting: Professional Counselor

## 2024-04-16 DIAGNOSIS — F4322 Adjustment disorder with anxiety: Secondary | ICD-10-CM

## 2024-04-16 NOTE — Progress Notes (Signed)
      [facility] Counselor/Therapist Progress Note  Patient ID: Maurice Gomez, MRN: [id number],    Date: 04/16/2024  Time Spent: 9:08 AM to 10:09 AM  Treatment Type: Individual Therapy  Reported Symptoms: Grief/loss, sadness, worries, sleep concerns, stress, interpersonal concerns, phase of life concerns  Mental Status Exam:  Appearance:   Neat     Behavior:  Appropriate, Sharing, and Motivated  Motor:  Normal  Speech/Language:   Clear and Coherent and Normal Rate  Affect:  Tearful  Mood:  normal  Thought process:  normal  Thought content:    WNL  Sensory/Perceptual disturbances:    WNL  Orientation:  oriented to person, place, time/date, and situation  Attention:  Good  Concentration:  Good  Memory:  WNL  Fund of knowledge:   Good  Insight:    Good  Judgment:   Good  Impulse Control:  Good   Risk Assessment: Danger to Self:  No Self-injurious Behavior: No Danger to Others: No Duty to Warn:no Physical Aggression / Violence:No  Access to Firearms a concern: No  Gang Involvement:No   Subjective: Patient presented to session to address concerns of anxiety.  He reported mixed progress at this time.  Patient processed the experience of having been on a trip that was very meaningful to him in terms of social network and relationships of the past, and he processed the experience of his life and self reflection.  He processed the experience of grief and loss by history.  Counselor actively listened, affirmed patient feelings and experience, and assisted with facilitation and insight and meaning making.  Patient also processed the experience of his wife's health at this time, and her improved relationship with their daughter.  He identified wife to be considering to return to rehabilitation services for follow-up.  Interventions: Solution-Oriented/Positive Psychology, Humanistic/Existential, and Insight-Oriented  Diagnosis:   ICD-10-CM   1. Adjustment disorder with  anxiety  F43.22       Plan: Patient is scheduled for follow-up; continue process work and developing coping skills.  STG between sessions for patient to continue to resource and prioritize self-care in role as primary support person for spouse.  Almarie ONEIDA Sprang, [facility]

## 2024-04-27 ENCOUNTER — Ambulatory Visit: Admitting: Professional Counselor

## 2024-05-05 ENCOUNTER — Ambulatory Visit: Admitting: Professional Counselor

## 2024-05-12 ENCOUNTER — Ambulatory Visit (INDEPENDENT_AMBULATORY_CARE_PROVIDER_SITE_OTHER): Admitting: Professional Counselor

## 2024-05-12 ENCOUNTER — Encounter: Payer: Self-pay | Admitting: Professional Counselor

## 2024-05-12 DIAGNOSIS — F4322 Adjustment disorder with anxiety: Secondary | ICD-10-CM | POA: Diagnosis not present

## 2024-05-12 NOTE — Progress Notes (Signed)
      [facility] Counselor/Therapist Progress Note  Patient ID: Maurice Gomez, MRN: [id number],    Date: 05/12/2024  Time Spent: 4:12 PM to 5:21 PM  Treatment Type: Individual Therapy  Reported Symptoms: Restlessness, worries, caregiver strain, sadness, interpersonal concerns, phase of life concerns  Mental Status Exam:  Appearance:   Neat     Behavior:  Appropriate, Sharing, and Motivated  Motor:  Normal  Speech/Language:   Clear and Coherent and Normal Rate  Affect:  Appropriate and Congruent  Mood:  normal  Thought process:  normal  Thought content:    WNL  Sensory/Perceptual disturbances:    WNL  Orientation:  oriented to person, place, time/date, and situation  Attention:  Good  Concentration:  Good  Memory:  WNL  Fund of knowledge:   Good  Insight:    Good  Judgment:   Good  Impulse Control:  Good   Risk Assessment: Danger to Self:  No Self-injurious Behavior: No Danger to Others: No Duty to Warn:no Physical Aggression / Violence:No  Access to Firearms a concern: No  Gang Involvement:No   Subjective: Patient presented to session to address concerns of anxiety.  He reported makes progress at this time.  He reported to be attending Al-Anon meetings, and counselor affirmed and reinforced supportive outlet.  Patient processed experience of caregiver strain regarding his wife's health and mental health concerns, and counselor actively listened and affirmed patient feelings and experience.  Counselor helped to facilitate insight into patient role as central axis in family, and is tendency to over function for others, and help resource mindfulness around his capacities and limitations.  Counselor and patient discussed patient honoring his gracious needs for his family will also seeking to protect himself, and to prioritize restorative time.  Interventions: Solution-Oriented/Positive Psychology, Humanistic/Existential, and Insight-Oriented  Diagnosis:   ICD-10-CM    1. Adjustment disorder with anxiety  F43.22       Plan: Patient is scheduled for follow-up; continue process work and developing coping skills.  STG between sessions for patient to continue to attend Al-Anon meetings, and resource mindfulness around self protection and personal restorative time, and empowering others to be self accountable.  Almarie ONEIDA Sprang, [facility]

## 2024-05-21 ENCOUNTER — Ambulatory Visit: Admitting: Professional Counselor

## 2024-06-02 ENCOUNTER — Ambulatory Visit (INDEPENDENT_AMBULATORY_CARE_PROVIDER_SITE_OTHER): Payer: Self-pay | Admitting: Professional Counselor

## 2024-06-02 DIAGNOSIS — Z0389 Encounter for observation for other suspected diseases and conditions ruled out: Secondary | ICD-10-CM

## 2024-06-02 NOTE — Progress Notes (Signed)
 Patient did not show for scheduled appointment. No message.  Maurice Gomez, [facility]

## 2024-06-08 ENCOUNTER — Ambulatory Visit: Admitting: Professional Counselor

## 2024-06-17 ENCOUNTER — Encounter: Payer: Self-pay | Admitting: Professional Counselor

## 2024-06-17 ENCOUNTER — Ambulatory Visit: Admitting: Professional Counselor

## 2024-06-17 DIAGNOSIS — F4322 Adjustment disorder with anxiety: Secondary | ICD-10-CM

## 2024-06-17 NOTE — Progress Notes (Signed)
      [facility] Counselor/Therapist Progress Note  Patient ID: Maurice Gomez, MRN: [id number],    Date: 06/17/2024  Time Spent: 11:12 AM to 12:10 PM  Treatment Type: Individual Therapy  Reported Symptoms: Caregiver strain, worries, interpersonal concerns, stress, phase of life concerns  Mental Status Exam:  Appearance:   Casual     Behavior:  Appropriate, Sharing, and Motivated  Motor:  Normal  Speech/Language:   Clear and Coherent and Normal Rate  Affect:  Appropriate and Congruent  Mood:  normal  Thought process:  normal  Thought content:    WNL  Sensory/Perceptual disturbances:    WNL  Orientation:  oriented to person, place, time/date, and situation  Attention:  Good  Concentration:  Good  Memory:  WNL  Fund of knowledge:   Good  Insight:    Good  Judgment:   Good  Impulse Control:  Good   Risk Assessment: Danger to Self:  No Self-injurious Behavior: No Danger to Others: No Duty to Warn:no Physical Aggression / Violence:No  Access to Firearms a concern: No  Gang Involvement:No   Subjective: Patient presented to session to address concerns of anxiety.  He reported mixed progress at this time.  He processed experience of caregiver strain and challenges in facing holistic concern regarding his wife's wellness.  He reported Al-Anon meetings to be going well, and recent retreat with male community to have been a positive enriching experience.  Counselor and patient discussed patient experience of challenges within marital relationship and caregiving.  Counselor reinforced patient use of reflective listening, and helped facilitate insight and develop interpersonal effectiveness strategies.  Counselor and patient discussed patient limiting investigative and/or fixing approaches, as offering solutions may provide satisfaction of effort of another without change/intrinsic motivation.  Counselor also provided psychoeducation on addictive processes.   Interventions:  Assertiveness/Communication, Solution-Oriented/Positive Psychology, Humanistic/Existential, and Insight-Oriented  Diagnosis:   ICD-10-CM   1. Adjustment disorder with anxiety  F43.22       Plan: Patient is scheduled for follow-up; continue process work and developing coping skills.  STG between sessions for patient to emphasize gains, practice making micro adjustments in approach to caregiving, and continue to prioritize self-care.  Almarie ONEIDA Sprang, [facility]

## 2024-06-29 ENCOUNTER — Telehealth: Payer: Self-pay | Admitting: Psychiatry

## 2024-06-29 ENCOUNTER — Other Ambulatory Visit: Payer: Self-pay

## 2024-06-29 DIAGNOSIS — F902 Attention-deficit hyperactivity disorder, combined type: Secondary | ICD-10-CM

## 2024-06-29 MED ORDER — AMPHETAMINE-DEXTROAMPHETAMINE 15 MG PO TABS: 15.0000 mg | ORAL_TABLET | Freq: Two times a day (BID) | ORAL | 0 refills | Status: AC

## 2024-06-29 MED ORDER — AMPHETAMINE-DEXTROAMPHET ER 30 MG PO CP24: 30.0000 mg | ORAL_CAPSULE | Freq: Every day | ORAL | 0 refills | Status: DC

## 2024-06-29 MED ORDER — AMPHETAMINE-DEXTROAMPHET ER 30 MG PO CP24
30.0000 mg | ORAL_CAPSULE | Freq: Every day | ORAL | 0 refills | Status: AC
Start: 2024-08-24 — End: ?

## 2024-06-29 MED ORDER — AMPHETAMINE-DEXTROAMPHETAMINE 15 MG PO TABS
15.0000 mg | ORAL_TABLET | Freq: Two times a day (BID) | ORAL | 0 refills | Status: AC
Start: 2024-06-29 — End: ?

## 2024-06-29 MED ORDER — AMPHETAMINE-DEXTROAMPHETAMINE 15 MG PO TABS: 15.0000 mg | ORAL_TABLET | Freq: Two times a day (BID) | ORAL | 0 refills | Status: DC

## 2024-06-29 NOTE — Telephone Encounter (Signed)
 Pended

## 2024-06-29 NOTE — Telephone Encounter (Signed)
 Maurice Gomez called asking for refills on his adderall xr 30 mg and adderall 15 mg. Pharmacy is [facility] [address]

## 2024-07-05 ENCOUNTER — Other Ambulatory Visit: Payer: Self-pay | Admitting: Urology

## 2024-07-05 DIAGNOSIS — C61 Malignant neoplasm of prostate: Secondary | ICD-10-CM

## 2024-07-06 ENCOUNTER — Telehealth: Payer: Self-pay | Admitting: Psychiatry

## 2024-07-06 NOTE — Telephone Encounter (Signed)
 Patient called in for refill on Zaleplon  10mg . Ph: [phone] Appt 12/15 Pharmacy [facility] [address] [place], [place]

## 2024-07-06 NOTE — Telephone Encounter (Signed)
 LF 9/26, due 10/24

## 2024-07-09 ENCOUNTER — Other Ambulatory Visit: Payer: Self-pay

## 2024-07-09 DIAGNOSIS — F5105 Insomnia due to other mental disorder: Secondary | ICD-10-CM

## 2024-07-09 MED ORDER — ZALEPLON 10 MG PO CAPS: 10.0000 mg | ORAL_CAPSULE | Freq: Every evening | ORAL | 1 refills | Status: AC | PRN

## 2024-07-09 NOTE — Telephone Encounter (Signed)
 Pended

## 2024-07-22 ENCOUNTER — Ambulatory Visit (INDEPENDENT_AMBULATORY_CARE_PROVIDER_SITE_OTHER): Admitting: Professional Counselor

## 2024-08-20 ENCOUNTER — Ambulatory Visit: Admitting: Professional Counselor

## 2024-08-30 ENCOUNTER — Ambulatory Visit: Admitting: Psychiatry

## 2024-08-30 ENCOUNTER — Telehealth: Payer: Self-pay | Admitting: Psychiatry

## 2024-08-30 NOTE — Telephone Encounter (Signed)
 Pt appt had to be r/s   Appt 2/4   Needs Jan rf of Adderall scripts and a rf of Sonata  for Dec.     [facility] [address]

## 2024-08-31 ENCOUNTER — Other Ambulatory Visit: Payer: Self-pay

## 2024-08-31 DIAGNOSIS — F902 Attention-deficit hyperactivity disorder, combined type: Secondary | ICD-10-CM

## 2024-08-31 NOTE — Telephone Encounter (Signed)
 08/08/2024 07/09/2024 2  Zaleplon  10 Mg Capsule 30.00 30 Ca Cot [id number] Nor ([id number]) 1/1 0.50 LME Comm Ins [place] 08/04/2024 06/29/2024 2  Dextroamp-Amphet Er 30 Mg Cap 30.00 30 Ca Cot

## 2024-08-31 NOTE — Telephone Encounter (Signed)
 Pended Adderall, postponed Sonata .

## 2024-09-01 MED ORDER — AMPHETAMINE-DEXTROAMPHET ER 30 MG PO CP24: 30.0000 mg | ORAL_CAPSULE | Freq: Every day | ORAL | 0 refills | Status: AC

## 2024-09-01 MED ORDER — AMPHETAMINE-DEXTROAMPHET ER 30 MG PO CP24: 30.0000 mg | ORAL_CAPSULE | Freq: Every day | ORAL | 0 refills | Status: DC

## 2024-09-02 ENCOUNTER — Encounter: Payer: Self-pay | Admitting: Professional Counselor

## 2024-09-02 ENCOUNTER — Ambulatory Visit: Admitting: Professional Counselor

## 2024-09-02 DIAGNOSIS — F4322 Adjustment disorder with anxiety: Secondary | ICD-10-CM | POA: Diagnosis not present

## 2024-09-02 NOTE — Progress Notes (Signed)
"   °      [facility] Counselor/Therapist Progress Note  Patient ID: Maurice Gomez, MRN: [id number],    Date: 09/02/2024  Time Spent: 3:11 PM to 4:10 PM  Treatment Type: Individual Therapy  Reported Symptoms: worries, frustration, sadness, interpersonal concern, stress, restlessness, fatigue, phase of life concerns   Mental Status Exam:  Appearance:   Neat     Behavior:  Appropriate and Sharing  Motor:  Normal  Speech/Language:   Clear and Coherent and Normal Rate  Affect:  Appropriate and Congruent  Mood:  normal  Thought process:  normal  Thought content:    WNL  Sensory/Perceptual disturbances:    WNL  Orientation:  oriented to person, place, time/date, and situation  Attention:  Good  Concentration:  Good  Memory:  WNL  Fund of knowledge:   Good  Insight:    Good  Judgment:   Good  Impulse Control:  Good   Risk Assessment: Danger to Self:  No Self-injurious Behavior: No Danger to Others: No Duty to Warn:no Physical Aggression / Violence:No  Access to Firearms a concern: No  Gang Involvement:No   Subjective: Patient presented to session to address concerns of anxiety.  He reported mixed progress at this time.  He identified concern regarding his wife's state of health as worsening.  Counselor actively listened and assisted patient in considering strategies to support her while also safeguarding against enabling and protecting patient wellbeing.  Counselor and patient discussed holistic safety around circumstances, including possibility of increased medical input, a family intervention, and/or support from a trusted person outside of the family.  Patient identified positive experiences per quality time with his son and granddaughter, and continuing to receive support through [employer].  Counselor reinforced patient pleasant activities, self-care, spirituality and connecting time with loved ones, and his identified changes in interpersonal effectiveness as with his  daughter.  Interventions: Solution-Oriented/Positive Psychology, Humanistic/Existential, and Insight-Oriented  Diagnosis:   ICD-10-CM   1. Adjustment disorder with anxiety  F43.22       Plan: Patient is scheduled for follow-up; continue process work and developing coping skills.  Patient short-term goal between sessions to consider options around increased care around wife's complexity of concerns, continue self-care including pleasant activities and quality time with loved ones, and resourcing of support.  Almarie ONEIDA Sprang, [facility]                   "

## 2024-09-03 ENCOUNTER — Other Ambulatory Visit: Payer: Self-pay

## 2024-09-03 ENCOUNTER — Inpatient Hospital Stay: Admission: RE | Admit: 2024-09-03 | Discharge: 2024-09-03 | Attending: Urology | Admitting: Urology

## 2024-09-03 DIAGNOSIS — F5105 Insomnia due to other mental disorder: Secondary | ICD-10-CM

## 2024-09-03 DIAGNOSIS — C61 Malignant neoplasm of prostate: Secondary | ICD-10-CM

## 2024-09-03 MED ORDER — ZALEPLON 10 MG PO CAPS: 10.0000 mg | ORAL_CAPSULE | Freq: Every evening | ORAL | 1 refills | Status: DC | PRN

## 2024-09-03 MED ORDER — GADOPICLENOL 0.5 MMOL/ML IV SOLN
10.0000 mL | Freq: Once | INTRAVENOUS | Status: AC | PRN
  Administered 2024-09-03: 10 mL via INTRAVENOUS

## 2024-09-03 NOTE — Telephone Encounter (Signed)
Pended [employer]

## 2024-09-06 ENCOUNTER — Telehealth: Payer: Self-pay | Admitting: Psychiatry

## 2024-09-06 NOTE — Telephone Encounter (Signed)
 Confirmed with pharmacy and pharmacy does have Rx for Adderall 15. They will fill both doses today. Patient notified.

## 2024-09-06 NOTE — Telephone Encounter (Signed)
 Patient called in stating that his prescription for Adderall 15mg  was not sent and needs it sent to pharmacy. PH: [phone] Appt 2/6 Pharmacy [facility] [address] [place]

## 2024-09-07 ENCOUNTER — Ambulatory Visit: Admitting: Professional Counselor

## 2024-09-20 ENCOUNTER — Ambulatory Visit (INDEPENDENT_AMBULATORY_CARE_PROVIDER_SITE_OTHER): Payer: Self-pay | Admitting: Professional Counselor

## 2024-09-20 ENCOUNTER — Encounter: Payer: Self-pay | Admitting: Professional Counselor

## 2024-09-20 DIAGNOSIS — Z0389 Encounter for observation for other suspected diseases and conditions ruled out: Secondary | ICD-10-CM

## 2024-09-20 NOTE — Progress Notes (Signed)
 Patient did not show for scheduled appointment. No message.  Maurice Gomez, [facility]

## 2024-10-04 ENCOUNTER — Ambulatory Visit: Admitting: Professional Counselor

## 2024-10-04 ENCOUNTER — Encounter: Payer: Self-pay | Admitting: Professional Counselor

## 2024-10-04 DIAGNOSIS — F4323 Adjustment disorder with mixed anxiety and depressed mood: Secondary | ICD-10-CM

## 2024-10-04 NOTE — Progress Notes (Signed)
"   °      [facility] Counselor/Therapist Progress Note  Patient ID: Maurice Gomez, MRN: [id number],    Date: 10/04/2024  Time Spent: 10:06 AM to 11:10 AM  Treatment Type: Individual Therapy  Reported Symptoms: anhedonia, low mood, sadness, sleeplessness, fatigue, appetite concerns, trouble concentrating, restlessness, anxiousness, worries, irritability, catastrophic thinking  Mental Status Exam:  Appearance:   Neat     Behavior:  Appropriate, Sharing, and Motivated  Motor:  Normal  Speech/Language:   Clear and Coherent and Normal Rate  Affect:  Appropriate and Congruent  Mood:  sad  Thought process:  normal  Thought content:    WNL  Sensory/Perceptual disturbances:    WNL  Orientation:  oriented to person, place, time/date, and situation  Attention:  Good  Concentration:  Good  Memory:  WNL  Fund of knowledge:   Good  Insight:    Good  Judgment:   Good  Impulse Control:  Good   Risk Assessment: Danger to Self:  No Self-injurious Behavior: No Danger to Others: No Duty to Warn:no Physical Aggression / Violence:No  Access to Firearms a concern: No  Gang Involvement:No   Subjective: Patient presented to session to address concerns of anxiety and depression.  He reported exacerbated symptoms at this time.  Counselor facilitated GAD-7, patient scored a 10, PHQ-9, patient scored an 11.  Counselor and patient discussed results.  Patient identified sleeplessness, only getting approximately 3 hours of sleep per night.  He processed experience of sadness around concern for his wife, and unfolding family conflict with daughter.  Counselor and patient discussed patient concerns at length.  Counselor actively listened, affirmed patient feelings and experience, and encouraged patient continued self advocacy and emotional self protection.  Counselor assisted patient in developing interpersonal effectiveness strategies, including boundary setting and Building Control Surveyor and  patient discussed patient treatment plan renewal, to be continued upon follow-up.  Interventions: Assertiveness/Communication, Solution-Oriented/Positive Psychology, Humanistic/Existential, Insight-Oriented, and Assessments, Treatment Planning  Diagnosis:   ICD-10-CM   1. Adjustment disorder with mixed anxiety and depressed mood  F43.23       Plan: Patient is scheduled for follow-up; continue process work and developing coping skills; continue to discuss renewed treatment plan and obtain consent.  Patient short-term goal between sessions to continue resourcing interpersonal effectiveness skills and self advocacy strategies.  Maurice Gomez, [facility]                   "

## 2024-10-20 ENCOUNTER — Encounter: Payer: Self-pay | Admitting: Psychiatry

## 2024-10-20 ENCOUNTER — Ambulatory Visit: Admitting: Psychiatry

## 2024-10-20 DIAGNOSIS — F5105 Insomnia due to other mental disorder: Secondary | ICD-10-CM | POA: Diagnosis not present

## 2024-10-20 DIAGNOSIS — F902 Attention-deficit hyperactivity disorder, combined type: Secondary | ICD-10-CM | POA: Diagnosis not present

## 2024-10-20 MED ORDER — AMPHETAMINE-DEXTROAMPHET ER 30 MG PO CP24: 30.0000 mg | ORAL_CAPSULE | Freq: Every day | ORAL | 0 refills | Status: AC

## 2024-10-20 MED ORDER — ZALEPLON 10 MG PO CAPS: 10.0000 mg | ORAL_CAPSULE | Freq: Every evening | ORAL | 1 refills | Status: AC | PRN

## 2024-10-20 MED ORDER — AMPHETAMINE-DEXTROAMPHETAMINE 15 MG PO TABS: 15.0000 mg | ORAL_TABLET | Freq: Two times a day (BID) | ORAL | 0 refills | Status: AC

## 2024-10-20 NOTE — Progress Notes (Signed)
 [name] [id number] 1956-06-10 69 y.o.    Subjective:   Patient ID:  Maurice Gomez is a 69 y.o. (DOB Mar 05, 1956) male.  Chief Complaint:  Chief Complaint  Patient presents with   Follow-up   ADD   Sleeping Problem    HPI Zoltan A Gadsby presents to the office today for follow-up of ADD.    Increased Vyvanse  to 60 mg 2019.  seen March 04, 2019.  No meds were changed.  He continued Vyvanse  60 mg daily.  Oral days when he forgot Vyvanse  he would take Adderall. Also variety of tx for sleep have been used.  02/2020 appt noted: Working long hours.  Busy but OK.  Candis still sleeps until noon and has had some heart issues.  She's had MI.   Sleep is better but not great.   4 to 4& 1/2 hours sleep and not tired. Vyvanse  only lasts about 6 hours now.  Problems with sleep.  No problems going. Awaken after 4 hours and difficult to go back to sleep.  Gettting worse and going on for a year.  Misses the lack of sleep. Temazepam inconsistently keeps him asleep and doesn't want to take it all the time DT feeling drugged.  Doesn't believe related to stimulant bc independent of vyvanse  use.  Never comfortable when he sleeps. Some pain issues, laying.  Tried new mattress.  Tried sonata  at onset but awoke.  Also has to get up to urinate. Plan: Try Mydayis  50 bc inadequate duration from Vyvanse . Otherwise using Adderall in Am and Vyvanse  60 at noon.  10/23/2020 appointment with the following noted: Mydayis  never tried.   Been a little worried about weight and BP.  BP got elevated with weight gain. BP usually 140-150/85-95. Dx OSA AHI 36 and can't get CPAP for 3 mos DT chip shortage.  Loud snoring and nonrestorative sleep and fatigue ADD would like longer duration. Patient reports stable mood and denies depressed or irritable moods.  Patient denies any recent difficulty with anxiety. Denies appetite disturbance.  Patient reports that energy and motivation have been good.     Patient denies any suicidal ideation. Plan: Try Mydayis  50 bc inadequate duration from Vyvanse . Otherwise using Adderall in Am and Vyvanse  60 at noon.  04/24/2021 appointment with the following noted: SP Covid this summer.  Still testing positive.  Still some loss of taste and smell and slight cough.   Candis and [name] never tested positive or got sick. Also had period of back problem. Likes Mydayis  with better duration but still not 16 hours. 50 mg was too much. Still needs Adderall bc if forgets Mydayis  can take it later. Vyvanse  super effective but crashes midday.  Mydayis  less effective without crash and works. Change BP meds and it's managed. Plan: Mydayis  37.5 mg daily bc inadequate duration from Vyvanse .  10/24/2021 appointment with the following noted: Likes Mydayis  duration better but not.  Felt the amphetamine  a bit more with Vyvanse .   Sleep is not as good as should be.  Struggle with CPAP.  Using nasal pillows.  Was off for 2 mos.  Restarted 3 weeks ago.  It leaks.   Sleeps probably 4 hours and sometimes less.  Not comfortable in bed.  Restored initially when wakes and then gets tired. Patient reports stable mood and denies depressed or irritable moods.  Patient denies any recent difficulty with anxiety.  Denies appetite disturbance.  Patient reports that energy and motivation have been good.   Patient denies any  suicidal ideation.  02/19/22 appt noted: On Mydayis  37.5 mg daily or Adderall. No SE Focus drifting a little in a way it has not in years.  Took D's vyvanse  60 and it seemed better. Takes Adderall if forgets Mydayis . Not using CPAP.   Plan: Increase Mydayis  50 mg daily bc inadequate duration from Vyvanse  and need for better focus  08/06/22 appt noted: On Mydayis  50 mg AM or Adderall 15 TID.   Better after increase and tolerates it well.   Sonata  gives 4 hours.  Not sleepy in the day.   No new complaints. No changes  02/04/23 appt noted: Meds: lately Adderall Xr 30 AM,  and some Adderall 15 mg typically BID- TID in place of Mydayis . Sleep better with Adderall vs Mydayis  Working . Meds Zaleplon  for EMA. Continue meds and satisfied. No SE.   03/01/24 appt noted:  Med: Adderall XR 30 AM and usually both bc duration Adderall 15 mg TID prn, Sonata  10 HS Pleased with it.  No complaints re: stimulant.  No SE usually except prostate issues with some initial U hesitancy. Using 1/4 finasteride and Flomax and it is ok.   Just in for uro check for prostate but it is stable with PSA. Errs on side of not taking Sonata .  10/20/24 appt noted:  Med: Adderall XR 30 AM and usually both bc duration Adderall 15 mg TID prn, Sonata  10 HS No issues with Adderall and sleep is OK.  Went from less than 4 hours to better with magnesium.   Without it after 3 and 1/2 hours awakens and has to get up but with Mg he can go back to sleep.  Use Auroa ring  and without Mg less REM and less deep sleep and sleep score went from 50s to 70s. Satisfied with meds.     Prior sleep meds tried include:  Ambien  for a week which worked but nervous over the withdrawal effects he had in the past.   Restoril 30, Xanax, Diazepam, trazodone  hangover,  Sonata   Mirtazapine  7.5 severe hangover  Other previous psychiatric medications include include Ritalin, Concerta 36, Ritalin SR, Daytrana with skin irritation, Vyvanse  60, Adderall, Mydayis  never tried, Strattera  Review of Systems:  Review of Systems  Constitutional:  Negative for fatigue.  Cardiovascular:  Negative for palpitations.  Genitourinary:  Negative for difficulty urinating.  Musculoskeletal:  Positive for arthralgias, back pain and joint swelling. Negative for neck pain.  Neurological:  Negative for tremors.  Psychiatric/Behavioral:  Negative for agitation, behavioral problems, confusion, decreased concentration, dysphoric mood, hallucinations, self-injury, sleep disturbance and suicidal ideas. The patient is not nervous/anxious and is not  hyperactive.     Medications: Prior to Admission: (Not in a hospital admission)   Current Outpatient Medications  Medication Sig Dispense Refill   amphetamine -dextroamphetamine  (ADDERALL XR) 30 MG 24 hr capsule Take 1 capsule (30 mg total) by mouth daily. 30 capsule 0   amphetamine -dextroamphetamine  (ADDERALL XR) 30 MG 24 hr capsule Take 1 capsule (30 mg total) by mouth daily. 30 capsule 0   amphetamine -dextroamphetamine  (ADDERALL) 15 MG tablet Take 1 tablet by mouth 2 (two) times daily. 60 tablet 0   amphetamine -dextroamphetamine  (ADDERALL) 15 MG tablet Take 1 tablet by mouth 2 (two) times daily. 60 tablet 0   levothyroxine (SYNTHROID) 112 MCG tablet Take 112 mcg by mouth every morning.     losartan -hydrochlorothiazide (HYZAAR) 50-12.5 MG tablet Take 1 tablet by mouth daily.     pantoprazole (PROTONIX) 20 MG tablet Take 20 mg by  mouth daily.     rosuvastatin (CRESTOR) 5 MG tablet Take 5 mg by mouth daily.     tadalafil  (CIALIS ) 10 MG tablet TAKE ONE TABLET BY MOUTH DAILY AS NEEDED FOR ERECTILE DYSFUNCTION 90 tablet 0   Tamsulosin HCl (FLOMAX) 0.4 MG CAPS Take by mouth at bedtime.     amphetamine -dextroamphetamine  (ADDERALL XR) 30 MG 24 hr capsule Take 1 capsule (30 mg total) by mouth daily. 90 capsule 0   amphetamine -dextroamphetamine  (ADDERALL) 15 MG tablet Take 1 tablet by mouth 2 (two) times daily. 180 tablet 0   zaleplon  (SONATA ) 10 MG capsule Take 1 capsule (10 mg total) by mouth at bedtime as needed for sleep. 90 capsule 1   No current facility-administered medications for this visit.    Medication Side Effects: None  Allergies:  Allergies  Allergen Reactions   Morphine And Codeine Itching    Past Medical History:  Diagnosis Date   Cystic kidney disease 2009   right (pt denies)   Heartburn    Hypercholesterolemia    Microscopic hematuria 2009   Polyp of colon    Prostate cancer (HCC) 07/27/12   gleason 3+3=6, vol37 ml   Pulmonary embolism (HCC)    hx   Thyroid   disease    hypothyroid    Family History  Problem Relation Age of Onset   Cancer Brother        prostate, completed radiaiton tx 1 year ago   Colon cancer Neg Hx     Social History   Socioeconomic History   Marital status: Married    Spouse name: Not on file   Number of children: Not on file   Years of education: Not on file   Highest education level: Not on file  Occupational History   Not on file  Tobacco Use   Smoking status: Former    Current packs/day: 0.00    Types: Cigarettes    Quit date: 08/18/1987    Years since quitting: 37.2   Smokeless tobacco: Never  Substance and Sexual Activity   Alcohol use: No    Alcohol/week: 0.0 standard drinks of alcohol   Drug use: No   Sexual activity: Not on file  Other Topics Concern   Not on file  Social History Narrative   Not on file   Social Drivers of Health   Tobacco Use: Medium Risk (10/20/2024)   Patient History    Smoking Tobacco Use: Former    Smokeless Tobacco Use: Never    Passive Exposure: Not on Actuary Strain: Not on file  Food Insecurity: Not on file  Transportation Needs: Not on file  Physical Activity: Not on file  Stress: Not on file  Social Connections: Not on file  Intimate Partner Violence: Not on file  Depression (PHQ2-9): High Risk (10/04/2024)   Depression (PHQ2-9)    PHQ-2 Score: 11  Alcohol Screen: Not on file  Housing: Not on file  Utilities: Not on file  Health Literacy: Not on file    Past Medical History, Surgical history, Social history, and Family history were reviewed and updated as appropriate.   Please see review of systems for further details on the patient's review from today.   Objective:   Physical Exam:  There were no vitals taken for this visit.  Physical Exam Constitutional:      General: He is not in acute distress.    Appearance: He is obese.  Musculoskeletal:        General: No deformity.  Neurological:     Mental Status: He is alert and  oriented to person, place, and time.     Coordination: Coordination normal.     Gait: Gait normal.  Psychiatric:        Attention and Perception: Attention normal.        Mood and Affect: Mood is not anxious or depressed. Affect is not tearful or inappropriate.        Speech: Speech normal. Speech is not rapid and pressured.        Behavior: Behavior normal. Behavior is not hyperactive.        Thought Content: Thought content normal. Thought content is not delusional. Thought content does not include homicidal or suicidal ideation. Thought content does not include suicidal plan.        Cognition and Memory: Cognition normal.        Judgment: Judgment normal.     Comments: Insight intact. No auditory or visual hallucinations. No delusions.      Lab Review:  No results found for: NA, K, CL, CO2, GLUCOSE, BUN, CREATININE, CALCIUM, PROT, ALBUMIN, AST, ALT, ALKPHOS, BILITOT, GFRNONAA, GFRAA  No results found for: WBC, RBC, HGB, HCT, PLT, MCV, MCH, MCHC, RDW, LYMPHSABS, MONOABS, EOSABS, BASOSABS  No results found for: POCLITH, LITHIUM   No results found for: PHENYTOIN, PHENOBARB, VALPROATE, CBMZ   .res Assessment: Plan:    Cleotha Ric was seen today for follow-up, add and sleeping problem.  Diagnoses and all orders for this visit:  Attention deficit hyperactivity disorder (ADHD), combined type -     amphetamine -dextroamphetamine  (ADDERALL XR) 30 MG 24 hr capsule; Take 1 capsule (30 mg total) by mouth daily. -     amphetamine -dextroamphetamine  (ADDERALL) 15 MG tablet; Take 1 tablet by mouth 2 (two) times daily.  Insomnia due to mental condition -     zaleplon  (SONATA ) 10 MG capsule; Take 1 capsule (10 mg total) by mouth at bedtime as needed for sleep.    Reviewed stimulant types and options.  Options gabapentin, Intermezzo , trazodone , mirtazapine , sonata , amitriptyline, Seroquel and disc options and SE  each  Sleep is better.  Sleep is better with EMA.  prn Sonata  5-10 helps  Discussed potential benefits, risks, and side effects of stimulants with patient to include increased heart rate, palpitations, insomnia, increased anxiety, increased irritability, or decreased appetite.  Instructed patient to contact office if experiencing any significant tolerability issues.  Continue Adderall XR 30 AM and Adderall 15 BID-TID  No changes  6 mos  Lorene Macintosh, MD, DFAPA   Please see After Visit Summary for patient specific instructions.  Future Appointments  Date Time Provider Department Center  11/18/2024 10:00 AM [place] Almarie DASEN, [facility] None  12/02/2024  3:00 PM [place] Almarie DASEN, [facility] None  12/13/2024 10:00 AM [place] Almarie DASEN, [facility] None       No orders of the defined types were placed in this encounter.      -------------------------------

## 2024-11-18 ENCOUNTER — Ambulatory Visit (INDEPENDENT_AMBULATORY_CARE_PROVIDER_SITE_OTHER): Admitting: Professional Counselor

## 2024-12-02 ENCOUNTER — Ambulatory Visit: Admitting: Professional Counselor

## 2024-12-13 ENCOUNTER — Ambulatory Visit: Admitting: Professional Counselor

## 2025-04-19 ENCOUNTER — Ambulatory Visit: Admitting: Psychiatry
